# Patient Record
Sex: Male | Born: 1945
Health system: Southern US, Community
[De-identification: ages and names within clinical notes are randomized; demographics above are authoritative.]

## PROBLEM LIST (undated history)

## (undated) DIAGNOSIS — E7800 Pure hypercholesterolemia, unspecified: Secondary | ICD-10-CM

## (undated) DIAGNOSIS — R000 Tachycardia, unspecified: Secondary | ICD-10-CM

## (undated) DIAGNOSIS — N419 Inflammatory disease of prostate, unspecified: Secondary | ICD-10-CM

## (undated) DIAGNOSIS — K224 Dyskinesia of esophagus: Secondary | ICD-10-CM

## (undated) DIAGNOSIS — K219 Gastro-esophageal reflux disease without esophagitis: Secondary | ICD-10-CM

## (undated) DIAGNOSIS — Z87898 Personal history of other specified conditions: Secondary | ICD-10-CM

## (undated) DIAGNOSIS — C801 Malignant (primary) neoplasm, unspecified: Secondary | ICD-10-CM

## (undated) DIAGNOSIS — N4340 Spermatocele of epididymis, unspecified: Secondary | ICD-10-CM

## (undated) DIAGNOSIS — B002 Herpesviral gingivostomatitis and pharyngotonsillitis: Secondary | ICD-10-CM

## (undated) DIAGNOSIS — J302 Other seasonal allergic rhinitis: Secondary | ICD-10-CM

## (undated) DIAGNOSIS — K589 Irritable bowel syndrome without diarrhea: Secondary | ICD-10-CM

## (undated) DIAGNOSIS — N400 Enlarged prostate without lower urinary tract symptoms: Secondary | ICD-10-CM

## (undated) DIAGNOSIS — I2510 Atherosclerotic heart disease of native coronary artery without angina pectoris: Secondary | ICD-10-CM

## (undated) DIAGNOSIS — T8859XA Other complications of anesthesia, initial encounter: Secondary | ICD-10-CM

## (undated) DIAGNOSIS — E785 Hyperlipidemia, unspecified: Secondary | ICD-10-CM

## (undated) HISTORY — DX: Other seasonal allergic rhinitis: J30.2

## (undated) HISTORY — DX: Spermatocele of epididymis, unspecified: N43.40

## (undated) HISTORY — PX: HERNIA REPAIR: SHX51

## (undated) HISTORY — DX: Dyskinesia of esophagus: K22.4

## (undated) HISTORY — DX: Tachycardia, unspecified: R00.0

## (undated) HISTORY — PX: APPENDECTOMY: SHX54

## (undated) HISTORY — DX: Personal history of other specified conditions: Z87.898

## (undated) HISTORY — DX: Benign prostatic hyperplasia without lower urinary tract symptoms: N40.0

## (undated) HISTORY — PX: TONSILLECTOMY: SUR1361

## (undated) HISTORY — PX: TREATMENT FISTULA ANAL: SUR1390

## (undated) HISTORY — DX: Atherosclerotic heart disease of native coronary artery without angina pectoris: I25.10

## (undated) HISTORY — DX: Herpesviral gingivostomatitis and pharyngotonsillitis: B00.2

## (undated) HISTORY — DX: Inflammatory disease of prostate, unspecified: N41.9

## (undated) HISTORY — DX: Hyperlipidemia, unspecified: E78.5

---

## 1996-04-25 DIAGNOSIS — N419 Inflammatory disease of prostate, unspecified: Secondary | ICD-10-CM

## 1996-04-25 HISTORY — DX: Inflammatory disease of prostate, unspecified: N41.9

## 2000-01-12 ENCOUNTER — Ambulatory Visit (HOSPITAL_BASED_OUTPATIENT_CLINIC_OR_DEPARTMENT_OTHER): Admission: RE | Admit: 2000-01-12 | Discharge: 2000-01-12 | Payer: Self-pay | Admitting: Surgery

## 2003-07-10 ENCOUNTER — Ambulatory Visit (HOSPITAL_COMMUNITY): Admission: RE | Admit: 2003-07-10 | Discharge: 2003-07-10 | Payer: Self-pay | Admitting: Cardiology

## 2004-03-19 ENCOUNTER — Emergency Department (HOSPITAL_COMMUNITY): Admission: EM | Admit: 2004-03-19 | Discharge: 2004-03-19 | Payer: Self-pay | Admitting: Emergency Medicine

## 2004-11-10 ENCOUNTER — Ambulatory Visit (HOSPITAL_COMMUNITY): Admission: RE | Admit: 2004-11-10 | Discharge: 2004-11-10 | Payer: Self-pay | Admitting: Gastroenterology

## 2005-04-12 IMAGING — CR DG CHEST 2V
2 series · 2 of 2 positions shown · non-contrast
Comparison: none

CLINICAL DATA: Chest pressure.  Chest pain.  Nonsmoker.  Preop for cardiac catheterization.
 TWO VIEW CHEST 
 PA and lateral views of the chest are made without previous films for comparison and show some hyperaeration of the lungs with flattening of the hemidiaphragms and increased AP diameter of the chest.  No active infiltrate or consolidation is seen.  The heart, mediastinum and bony thorax appear normal.

[view not recorded (1 of 2)]
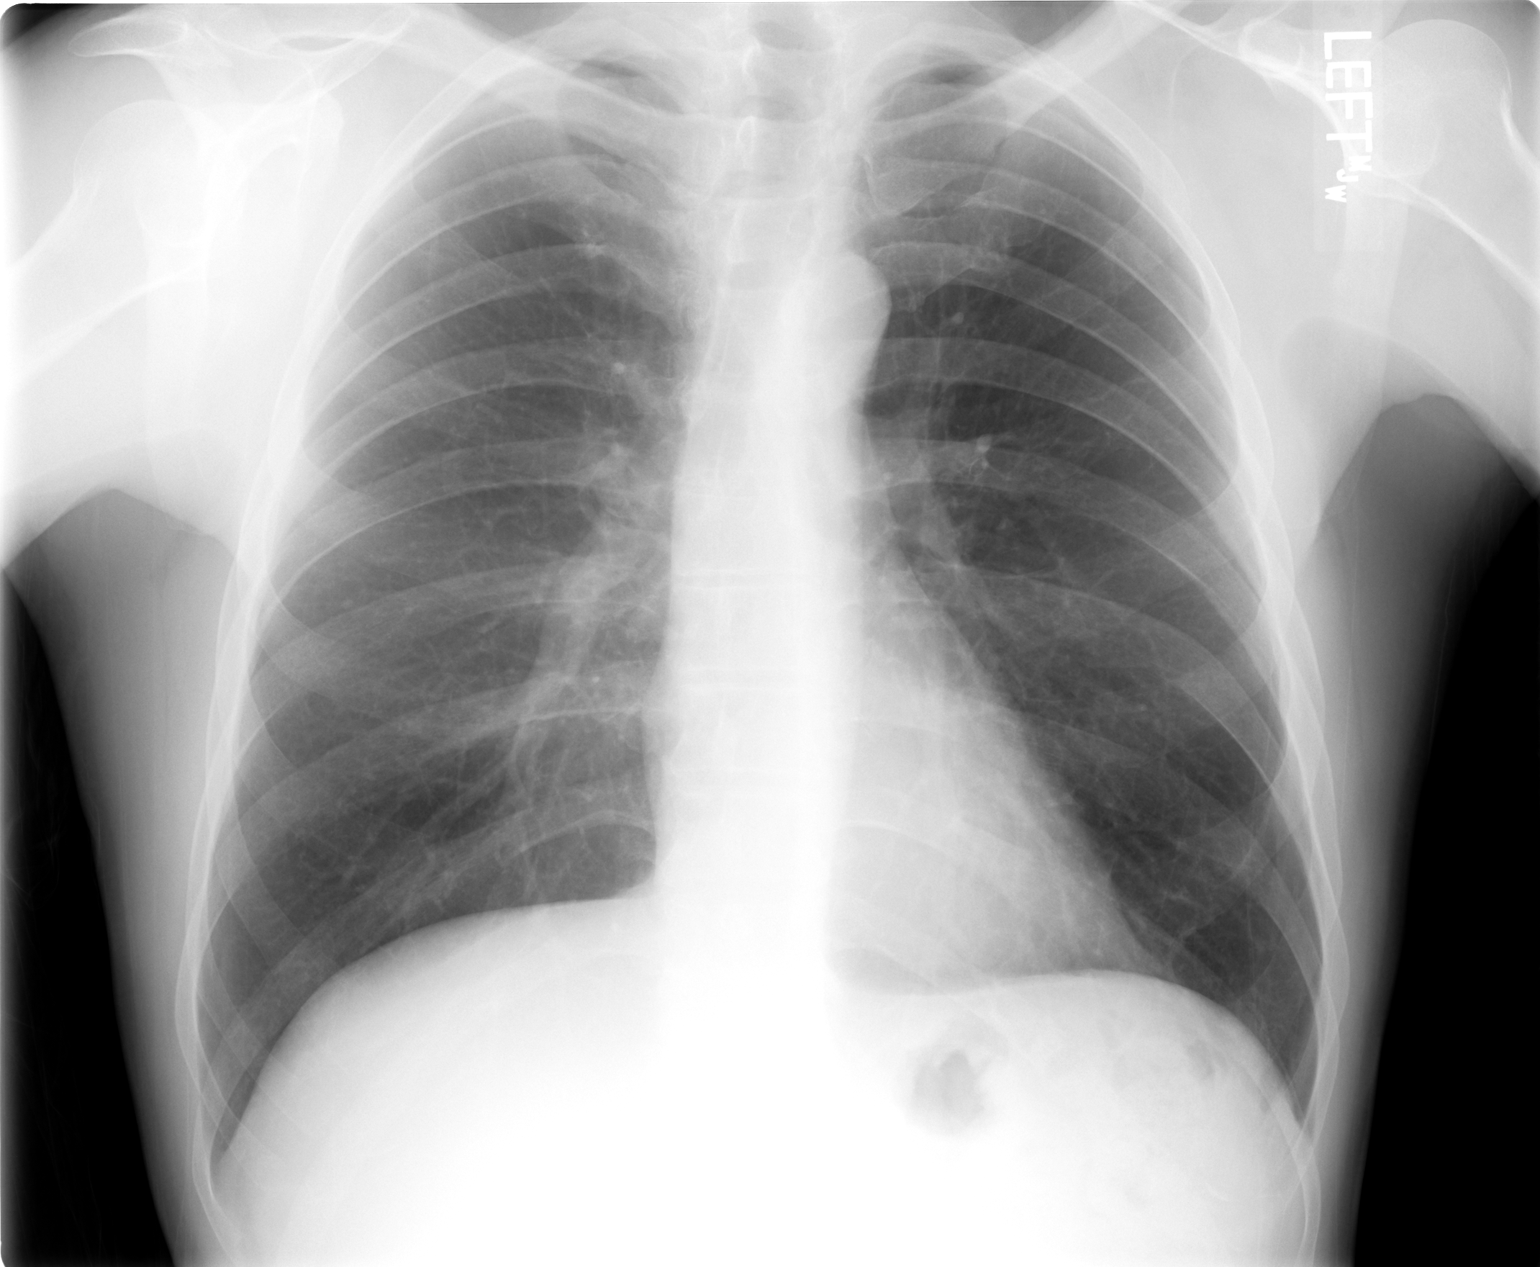

[view not recorded (2 of 2)]
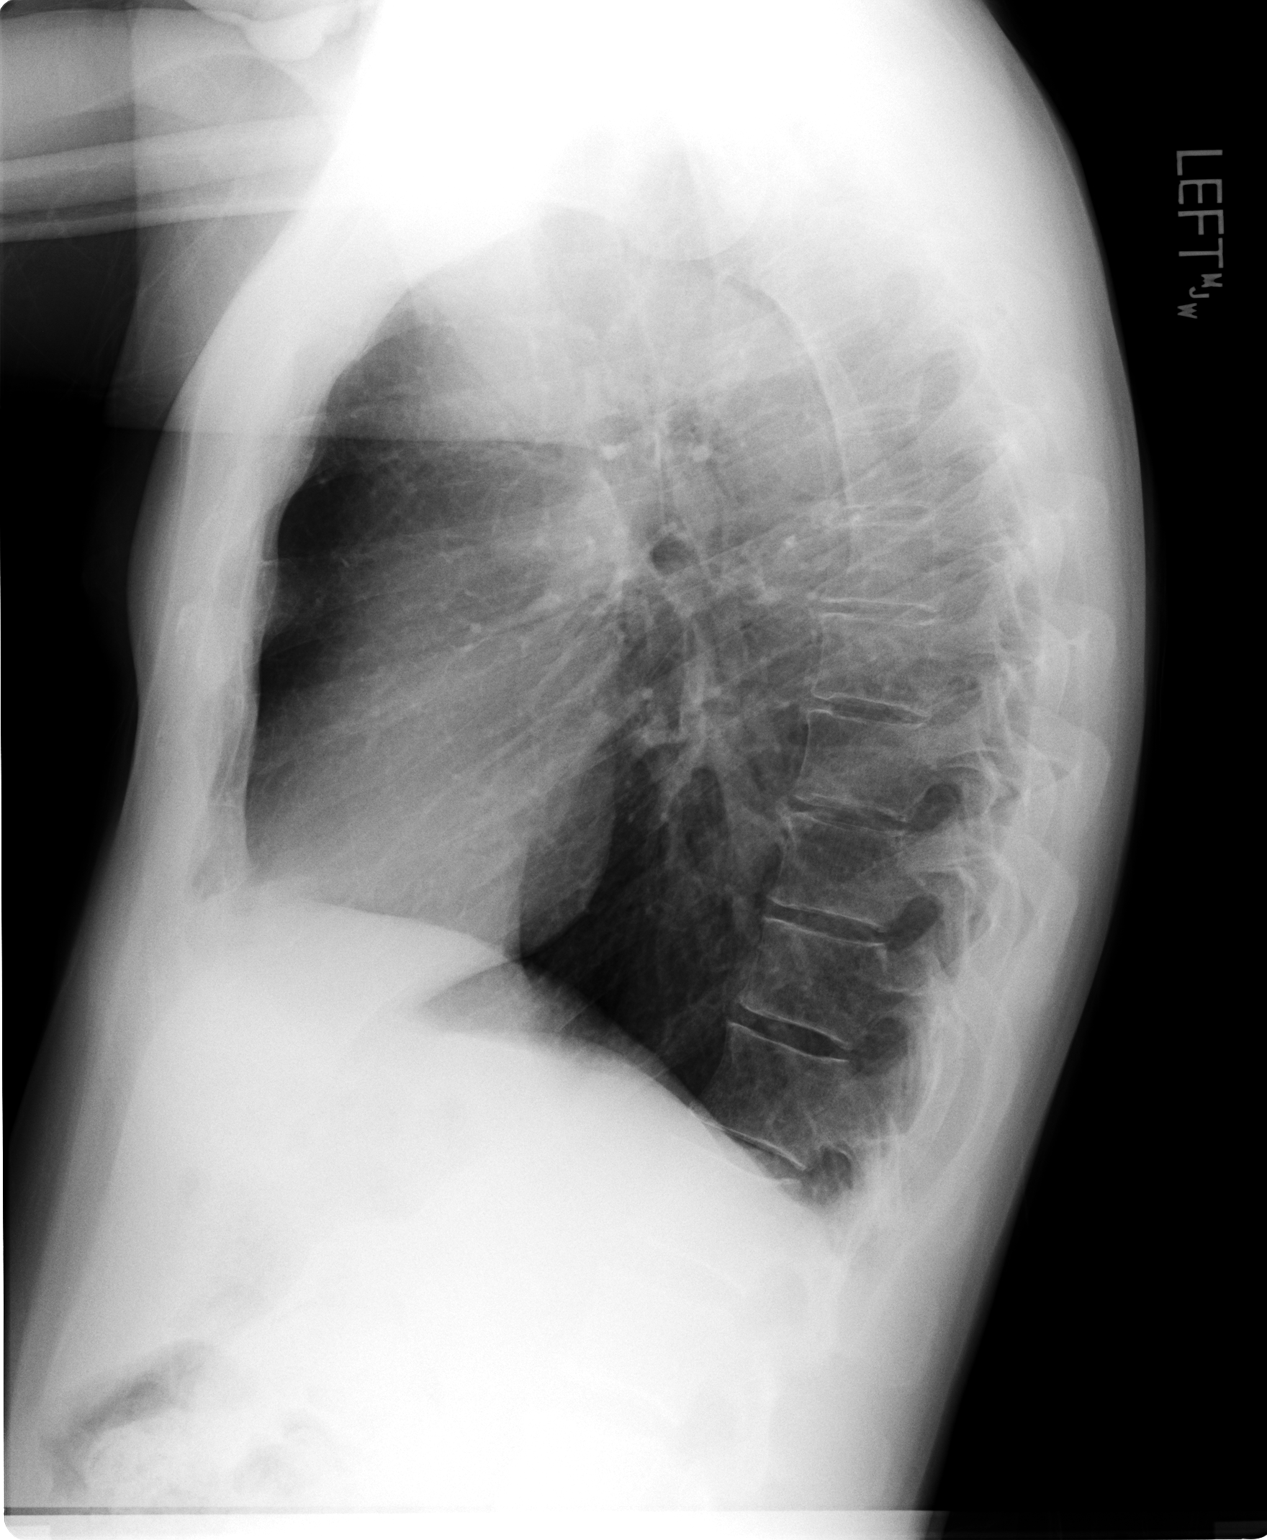

[2 of 2 positions shown; findings below may reference images not displayed]

IMPRESSION: Mild hyperinflation of the lungs.  No evidence of active disease.

## 2011-04-27 DIAGNOSIS — J029 Acute pharyngitis, unspecified: Secondary | ICD-10-CM | POA: Diagnosis not present

## 2011-04-27 DIAGNOSIS — J019 Acute sinusitis, unspecified: Secondary | ICD-10-CM | POA: Diagnosis not present

## 2011-04-27 DIAGNOSIS — R05 Cough: Secondary | ICD-10-CM | POA: Diagnosis not present

## 2011-07-18 DIAGNOSIS — R229 Localized swelling, mass and lump, unspecified: Secondary | ICD-10-CM | POA: Diagnosis not present

## 2011-08-17 DIAGNOSIS — Z1331 Encounter for screening for depression: Secondary | ICD-10-CM | POA: Diagnosis not present

## 2011-08-17 DIAGNOSIS — Z125 Encounter for screening for malignant neoplasm of prostate: Secondary | ICD-10-CM | POA: Diagnosis not present

## 2011-08-17 DIAGNOSIS — Z23 Encounter for immunization: Secondary | ICD-10-CM | POA: Diagnosis not present

## 2011-08-17 DIAGNOSIS — Z0000 Encounter for general adult medical examination without abnormal findings: Secondary | ICD-10-CM | POA: Diagnosis not present

## 2011-08-17 DIAGNOSIS — E785 Hyperlipidemia, unspecified: Secondary | ICD-10-CM | POA: Diagnosis not present

## 2012-02-14 DIAGNOSIS — Z23 Encounter for immunization: Secondary | ICD-10-CM | POA: Diagnosis not present

## 2012-04-09 DIAGNOSIS — H40019 Open angle with borderline findings, low risk, unspecified eye: Secondary | ICD-10-CM | POA: Diagnosis not present

## 2012-04-09 DIAGNOSIS — H52209 Unspecified astigmatism, unspecified eye: Secondary | ICD-10-CM | POA: Diagnosis not present

## 2012-04-09 DIAGNOSIS — H2510 Age-related nuclear cataract, unspecified eye: Secondary | ICD-10-CM | POA: Diagnosis not present

## 2012-04-09 DIAGNOSIS — H11009 Unspecified pterygium of unspecified eye: Secondary | ICD-10-CM | POA: Diagnosis not present

## 2012-05-17 DIAGNOSIS — R300 Dysuria: Secondary | ICD-10-CM | POA: Diagnosis not present

## 2012-05-17 DIAGNOSIS — N410 Acute prostatitis: Secondary | ICD-10-CM | POA: Diagnosis not present

## 2012-05-21 DIAGNOSIS — N390 Urinary tract infection, site not specified: Secondary | ICD-10-CM | POA: Diagnosis not present

## 2012-05-21 DIAGNOSIS — N410 Acute prostatitis: Secondary | ICD-10-CM | POA: Diagnosis not present

## 2012-05-28 DIAGNOSIS — N390 Urinary tract infection, site not specified: Secondary | ICD-10-CM | POA: Diagnosis not present

## 2012-08-21 DIAGNOSIS — Z1331 Encounter for screening for depression: Secondary | ICD-10-CM | POA: Diagnosis not present

## 2012-08-21 DIAGNOSIS — N419 Inflammatory disease of prostate, unspecified: Secondary | ICD-10-CM | POA: Diagnosis not present

## 2012-08-21 DIAGNOSIS — Z0000 Encounter for general adult medical examination without abnormal findings: Secondary | ICD-10-CM | POA: Diagnosis not present

## 2012-08-21 DIAGNOSIS — Z131 Encounter for screening for diabetes mellitus: Secondary | ICD-10-CM | POA: Diagnosis not present

## 2012-08-21 DIAGNOSIS — E785 Hyperlipidemia, unspecified: Secondary | ICD-10-CM | POA: Diagnosis not present

## 2013-02-05 DIAGNOSIS — Z23 Encounter for immunization: Secondary | ICD-10-CM | POA: Diagnosis not present

## 2013-04-12 DIAGNOSIS — H11009 Unspecified pterygium of unspecified eye: Secondary | ICD-10-CM | POA: Diagnosis not present

## 2013-04-12 DIAGNOSIS — H43819 Vitreous degeneration, unspecified eye: Secondary | ICD-10-CM | POA: Diagnosis not present

## 2013-04-12 DIAGNOSIS — H5200 Hypermetropia, unspecified eye: Secondary | ICD-10-CM | POA: Diagnosis not present

## 2013-04-12 DIAGNOSIS — H2510 Age-related nuclear cataract, unspecified eye: Secondary | ICD-10-CM | POA: Diagnosis not present

## 2013-07-07 ENCOUNTER — Encounter (HOSPITAL_COMMUNITY): Payer: Self-pay | Admitting: Emergency Medicine

## 2013-07-07 ENCOUNTER — Emergency Department (HOSPITAL_COMMUNITY): Payer: Medicare Other

## 2013-07-07 ENCOUNTER — Emergency Department (HOSPITAL_COMMUNITY)
Admission: EM | Admit: 2013-07-07 | Discharge: 2013-07-07 | Disposition: A | Discharge status: Home / Self Care | Payer: Medicare Other | Attending: Emergency Medicine | Admitting: Emergency Medicine

## 2013-07-07 DIAGNOSIS — E7800 Pure hypercholesterolemia, unspecified: Secondary | ICD-10-CM | POA: Insufficient documentation

## 2013-07-07 DIAGNOSIS — Z8639 Personal history of other endocrine, nutritional and metabolic disease: Secondary | ICD-10-CM | POA: Insufficient documentation

## 2013-07-07 DIAGNOSIS — R194 Change in bowel habit: Secondary | ICD-10-CM

## 2013-07-07 DIAGNOSIS — R42 Dizziness and giddiness: Secondary | ICD-10-CM | POA: Diagnosis not present

## 2013-07-07 DIAGNOSIS — K5900 Constipation, unspecified: Secondary | ICD-10-CM | POA: Insufficient documentation

## 2013-07-07 DIAGNOSIS — Z862 Personal history of diseases of the blood and blood-forming organs and certain disorders involving the immune mechanism: Secondary | ICD-10-CM | POA: Insufficient documentation

## 2013-07-07 DIAGNOSIS — R404 Transient alteration of awareness: Secondary | ICD-10-CM | POA: Diagnosis not present

## 2013-07-07 DIAGNOSIS — R198 Other specified symptoms and signs involving the digestive system and abdomen: Secondary | ICD-10-CM | POA: Insufficient documentation

## 2013-07-07 HISTORY — DX: Irritable bowel syndrome, unspecified: K58.9

## 2013-07-07 HISTORY — DX: Pure hypercholesterolemia, unspecified: E78.00

## 2013-07-07 LAB — URINALYSIS, ROUTINE W REFLEX MICROSCOPIC
Bilirubin Urine: NEGATIVE
Glucose, UA: NEGATIVE mg/dL
Hgb urine dipstick: NEGATIVE
Ketones, ur: NEGATIVE mg/dL
Leukocytes, UA: NEGATIVE
Nitrite: NEGATIVE
Protein, ur: NEGATIVE mg/dL
Specific Gravity, Urine: 1.019 (ref 1.005–1.030)
Urobilinogen, UA: 0.2 mg/dL (ref 0.0–1.0)
pH: 5.5 (ref 5.0–8.0)

## 2013-07-07 LAB — COMPREHENSIVE METABOLIC PANEL
ALT: 15 U/L (ref 0–53)
AST: 18 U/L (ref 0–37)
Albumin: 3.8 g/dL (ref 3.5–5.2)
Alkaline Phosphatase: 60 U/L (ref 39–117)
BUN: 15 mg/dL (ref 6–23)
CO2: 26 mEq/L (ref 19–32)
Calcium: 9.4 mg/dL (ref 8.4–10.5)
Chloride: 102 mEq/L (ref 96–112)
Creatinine, Ser: 0.98 mg/dL (ref 0.50–1.35)
GFR calc Af Amer: >90 mL/min (ref >90)
GFR calc non Af Amer: 83 mL/min — ABNORMAL LOW (ref >90)
Glucose, Bld: 115 mg/dL — ABNORMAL HIGH (ref 70–99)
Potassium: 4.2 mEq/L (ref 3.7–5.3)
Sodium: 140 mEq/L (ref 137–147)
Total Bilirubin: 0.2 mg/dL — ABNORMAL LOW (ref 0.3–1.2)
Total Protein: 6.6 g/dL (ref 6.0–8.3)

## 2013-07-07 LAB — CBC WITH DIFFERENTIAL/PLATELET
Basophils Absolute: 0 10*3/uL (ref 0.0–0.1)
Basophils Relative: 1 % (ref 0–1)
Eosinophils Absolute: 0.1 10*3/uL (ref 0.0–0.7)
Eosinophils Relative: 2 % (ref 0–5)
HCT: 42.7 % (ref 39.0–52.0)
Hemoglobin: 14.8 g/dL (ref 13.0–17.0)
Lymphocytes Relative: 38 % (ref 12–46)
Lymphs Abs: 1.9 10*3/uL (ref 0.7–4.0)
MCH: 31.2 pg (ref 26.0–34.0)
MCHC: 34.7 g/dL (ref 30.0–36.0)
MCV: 90.1 fL (ref 78.0–100.0)
Monocytes Absolute: 0.2 10*3/uL (ref 0.1–1.0)
Monocytes Relative: 5 % (ref 3–12)
Neutro Abs: 2.8 10*3/uL (ref 1.7–7.7)
Neutrophils Relative %: 55 % (ref 43–77)
Platelets: 218 10*3/uL (ref 150–400)
RBC: 4.74 MIL/uL (ref 4.22–5.81)
RDW: 13.6 % (ref 11.5–15.5)
WBC: 5.1 10*3/uL (ref 4.0–10.5)

## 2013-07-07 LAB — LIPASE, BLOOD: Lipase: 69 U/L — ABNORMAL HIGH (ref 11–59)

## 2013-07-07 MED ORDER — FLEET ENEMA 7-19 GM/118ML RE ENEM
1.0000 | ENEMA | Freq: Once | RECTAL | Status: AC
  Administered 2013-07-07: 1 via RECTAL
  Filled 2013-07-07: qty 1

## 2013-07-07 NOTE — ED Notes (Signed)
Dealing with constipation, took enema 3/14 AM, mineral oil, etc.  Went in to use the BR tonight, became dizzy, pale, clammy.  Has not had BM in approx one week.

## 2013-07-07 NOTE — Discharge Instructions (Signed)
You may continue to have diarrhea for the next 24 hours or so, this is to be expected. You may continue eating and drinking as normal. Follow up with your primary care physician if problems occur. Return to the ED for new or worsening symptoms.

## 2013-07-07 NOTE — ED Notes (Signed)
PT aware Urine sample is needed .

## 2013-07-07 NOTE — ED Provider Notes (Signed)
CSN: [id number]     Arrival date & time 07/07/13  0531 History   None    Chief Complaint  Patient presents with  . Abdominal Pain     (Consider location/radiation/quality/duration/timing/severity/associated sxs/prior Treatment) The history is provided by the patient and medical records.   This is a 68 y.o. M with PMH significant for hyperlipidemia and IBS presenting to the ED for constipation.  Pt states he has not been able to have a bowel movement in approx 1 week despite taking stool softeners and using an enema yesterday.  States he did have a tiny bowel movement a few days ago but it was not enough to provide any sort of relief.  Pt got up to use the bathroom tonight, became dizzy and diaphoretic with some mild nausea and dry heaves which resolved prior to arrival in the ED.  No nausea or vomiting until this morning.  No early satiety.  Pt denies taking any narcotic pain medications.  Does not usually have problems with constipation but does have IBS.  Pt is still able to freely pass flatus.  Prior abdominal surgeries including appendectomy and hernia repair.  Denies fevers or chills.  VS stable on arrival.  Past Medical History  Diagnosis Date  . Hypercholesterolemia   . Irritable bowel syndrome    Past Surgical History  Procedure Laterality Date  . Hernia repair    . Tonsillectomy    . Appendectomy    . Treatment fistula anal     History reviewed. No pertinent family history. History  Substance Use Topics  . Smoking status: Never Smoker   . Smokeless tobacco: Not on file  . Alcohol Use: Yes     Comment: 4 beers, 2 glasses of wine weekly    Review of Systems  Gastrointestinal: Positive for constipation.  All other systems reviewed and are negative.   Allergies  Review of patient's allergies indicates no known allergies.  Home Medications  No current outpatient prescriptions on file. Ht 5' 5.5" (1.664 m)  Wt 142 lb (64.411 kg)  BMI 23.26 kg/m2  Physical Exam   Nursing note and vitals reviewed. Constitutional: He is oriented to person, place, and time. He appears well-developed and well-nourished. No distress.  HENT:  Head: Normocephalic and atraumatic.  Mouth/Throat: Oropharynx is clear and moist.  Eyes: Conjunctivae and EOM are normal. Pupils are equal, round, and reactive to light.  Neck: Normal range of motion. Neck supple.  Cardiovascular: Normal rate, regular rhythm and normal heart sounds.   Pulmonary/Chest: Effort normal and breath sounds normal. No respiratory distress. He has no wheezes.  Abdominal: Soft. Bowel sounds are normal. There is no tenderness. There is no guarding and no CVA tenderness.  Abdomen soft, non-distended, no peritoneal signs  Musculoskeletal: Normal range of motion. He exhibits no edema.  Neurological: He is alert and oriented to person, place, and time.  Skin: Skin is warm and dry. He is not diaphoretic.  Psychiatric: He has a normal mood and affect.    ED Course  Procedures (including critical care time) Labs Review Labs Reviewed  COMPREHENSIVE METABOLIC PANEL - Abnormal; Notable for the following:    Glucose, Bld 115 (*)    Total Bilirubin 0.2 (*)    GFR calc non Af Amer 83 (*)    All other components within normal limits  LIPASE, BLOOD - Abnormal; Notable for the following:    Lipase 69 (*)    All other components within normal limits  CBC WITH DIFFERENTIAL  URINALYSIS, ROUTINE W REFLEX MICROSCOPIC   Imaging Review Dg Abd Acute W/chest  07/07/2013   CLINICAL DATA:  Syncope, constipation  EXAM: ACUTE ABDOMEN SERIES (ABDOMEN 2 VIEW & CHEST 1 VIEW)  COMPARISON:  Tensor prior  FINDINGS: There is no evidence of dilated bowel loops or free intraperitoneal air. No radiopaque calculi or other significant radiographic abnormality is seen. Heart size and mediastinal contours are within normal limits. Both lungs are clear. Large volume of stool noted throughout nondilated colon.  IMPRESSION: Large volume of stool  throughout nondilated colon.   Electronically Signed   By: Conchita Paris M.D.   On: 07/07/2013 07:28     EKG Interpretation None      MDM   Final diagnoses:  Change in bowel habits   On initial evaluation, pt is afebrile and overall non-toxic appearing.  He is not complaining of abdominal pain, distention, or nausea at this time.  He is freely passing flatus.  Will start with plain film, if inconclusive will obtain CT.  Acute abd series with large amount of stool, no dilated loops of bowel to suggest SBO.  Planned to give enema at home, however pts wife requests it be done here as he has tried several at home already.  9:09 AM Pt had moderate sized bowel movement, states he feels better and is ready to go home.  Advised that he may develop some diarrhea which is normal but he should not take immodium.  FU with PCP if problems occur.  Signs/sx that would warrant ED return including severe abdominal pain, nausea, vomiting, bloody diarrhea, dizziness, etc were discussed-- pt and wife acknowledged understanding and agreed with plan of care.  Larene Pickett, PA-C 07/07/13 1140

## 2013-07-10 DIAGNOSIS — K5900 Constipation, unspecified: Secondary | ICD-10-CM | POA: Diagnosis not present

## 2013-07-11 NOTE — ED Provider Notes (Signed)
Medical screening examination/treatment/procedure(s) were performed by non-physician practitioner and as supervising physician I was immediately available for consultation/collaboration.   EKG Interpretation None       Varney Biles, MD 07/11/13 0401

## 2013-08-29 DIAGNOSIS — Z125 Encounter for screening for malignant neoplasm of prostate: Secondary | ICD-10-CM | POA: Diagnosis not present

## 2013-08-29 DIAGNOSIS — E785 Hyperlipidemia, unspecified: Secondary | ICD-10-CM | POA: Diagnosis not present

## 2013-08-29 DIAGNOSIS — Z23 Encounter for immunization: Secondary | ICD-10-CM | POA: Diagnosis not present

## 2013-08-29 DIAGNOSIS — Z0000 Encounter for general adult medical examination without abnormal findings: Secondary | ICD-10-CM | POA: Diagnosis not present

## 2014-01-13 DIAGNOSIS — Z23 Encounter for immunization: Secondary | ICD-10-CM | POA: Diagnosis not present

## 2014-02-25 DIAGNOSIS — H52203 Unspecified astigmatism, bilateral: Secondary | ICD-10-CM | POA: Diagnosis not present

## 2014-02-25 DIAGNOSIS — H40013 Open angle with borderline findings, low risk, bilateral: Secondary | ICD-10-CM | POA: Diagnosis not present

## 2014-08-07 ENCOUNTER — Encounter: Payer: Self-pay | Admitting: Family Medicine

## 2014-08-07 NOTE — Telephone Encounter (Signed)
error 

## 2014-08-13 DIAGNOSIS — M9901 Segmental and somatic dysfunction of cervical region: Secondary | ICD-10-CM | POA: Diagnosis not present

## 2014-08-13 DIAGNOSIS — M9902 Segmental and somatic dysfunction of thoracic region: Secondary | ICD-10-CM | POA: Diagnosis not present

## 2014-08-13 DIAGNOSIS — M47812 Spondylosis without myelopathy or radiculopathy, cervical region: Secondary | ICD-10-CM | POA: Diagnosis not present

## 2014-08-13 DIAGNOSIS — M4304 Spondylolysis, thoracic region: Secondary | ICD-10-CM | POA: Diagnosis not present

## 2014-08-13 DIAGNOSIS — M461 Sacroiliitis, not elsewhere classified: Secondary | ICD-10-CM | POA: Diagnosis not present

## 2014-08-13 DIAGNOSIS — M9904 Segmental and somatic dysfunction of sacral region: Secondary | ICD-10-CM | POA: Diagnosis not present

## 2014-08-20 DIAGNOSIS — M4304 Spondylolysis, thoracic region: Secondary | ICD-10-CM | POA: Diagnosis not present

## 2014-08-20 DIAGNOSIS — M9902 Segmental and somatic dysfunction of thoracic region: Secondary | ICD-10-CM | POA: Diagnosis not present

## 2014-08-20 DIAGNOSIS — M47812 Spondylosis without myelopathy or radiculopathy, cervical region: Secondary | ICD-10-CM | POA: Diagnosis not present

## 2014-08-20 DIAGNOSIS — M9901 Segmental and somatic dysfunction of cervical region: Secondary | ICD-10-CM | POA: Diagnosis not present

## 2014-08-20 DIAGNOSIS — M9904 Segmental and somatic dysfunction of sacral region: Secondary | ICD-10-CM | POA: Diagnosis not present

## 2014-08-20 DIAGNOSIS — M461 Sacroiliitis, not elsewhere classified: Secondary | ICD-10-CM | POA: Diagnosis not present

## 2014-08-26 DIAGNOSIS — M4304 Spondylolysis, thoracic region: Secondary | ICD-10-CM | POA: Diagnosis not present

## 2014-08-26 DIAGNOSIS — M47812 Spondylosis without myelopathy or radiculopathy, cervical region: Secondary | ICD-10-CM | POA: Diagnosis not present

## 2014-08-26 DIAGNOSIS — M461 Sacroiliitis, not elsewhere classified: Secondary | ICD-10-CM | POA: Diagnosis not present

## 2014-08-26 DIAGNOSIS — M9901 Segmental and somatic dysfunction of cervical region: Secondary | ICD-10-CM | POA: Diagnosis not present

## 2014-08-26 DIAGNOSIS — M9902 Segmental and somatic dysfunction of thoracic region: Secondary | ICD-10-CM | POA: Diagnosis not present

## 2014-08-26 DIAGNOSIS — M9904 Segmental and somatic dysfunction of sacral region: Secondary | ICD-10-CM | POA: Diagnosis not present

## 2014-09-01 DIAGNOSIS — M47812 Spondylosis without myelopathy or radiculopathy, cervical region: Secondary | ICD-10-CM | POA: Diagnosis not present

## 2014-09-01 DIAGNOSIS — M4304 Spondylolysis, thoracic region: Secondary | ICD-10-CM | POA: Diagnosis not present

## 2014-09-01 DIAGNOSIS — M9904 Segmental and somatic dysfunction of sacral region: Secondary | ICD-10-CM | POA: Diagnosis not present

## 2014-09-01 DIAGNOSIS — M9902 Segmental and somatic dysfunction of thoracic region: Secondary | ICD-10-CM | POA: Diagnosis not present

## 2014-09-01 DIAGNOSIS — M9901 Segmental and somatic dysfunction of cervical region: Secondary | ICD-10-CM | POA: Diagnosis not present

## 2014-09-01 DIAGNOSIS — M461 Sacroiliitis, not elsewhere classified: Secondary | ICD-10-CM | POA: Diagnosis not present

## 2014-09-03 DIAGNOSIS — N400 Enlarged prostate without lower urinary tract symptoms: Secondary | ICD-10-CM | POA: Diagnosis not present

## 2014-09-03 DIAGNOSIS — Z0000 Encounter for general adult medical examination without abnormal findings: Secondary | ICD-10-CM | POA: Diagnosis not present

## 2014-09-03 DIAGNOSIS — Z1389 Encounter for screening for other disorder: Secondary | ICD-10-CM | POA: Diagnosis not present

## 2014-09-03 DIAGNOSIS — I2510 Atherosclerotic heart disease of native coronary artery without angina pectoris: Secondary | ICD-10-CM | POA: Diagnosis not present

## 2014-09-03 DIAGNOSIS — E780 Pure hypercholesterolemia: Secondary | ICD-10-CM | POA: Diagnosis not present

## 2014-09-03 DIAGNOSIS — Z125 Encounter for screening for malignant neoplasm of prostate: Secondary | ICD-10-CM | POA: Diagnosis not present

## 2014-09-17 DIAGNOSIS — M4304 Spondylolysis, thoracic region: Secondary | ICD-10-CM | POA: Diagnosis not present

## 2014-09-17 DIAGNOSIS — M9901 Segmental and somatic dysfunction of cervical region: Secondary | ICD-10-CM | POA: Diagnosis not present

## 2014-09-17 DIAGNOSIS — M47812 Spondylosis without myelopathy or radiculopathy, cervical region: Secondary | ICD-10-CM | POA: Diagnosis not present

## 2014-09-17 DIAGNOSIS — M461 Sacroiliitis, not elsewhere classified: Secondary | ICD-10-CM | POA: Diagnosis not present

## 2014-09-17 DIAGNOSIS — M9902 Segmental and somatic dysfunction of thoracic region: Secondary | ICD-10-CM | POA: Diagnosis not present

## 2014-09-17 DIAGNOSIS — M9904 Segmental and somatic dysfunction of sacral region: Secondary | ICD-10-CM | POA: Diagnosis not present

## 2014-11-05 DIAGNOSIS — M4304 Spondylolysis, thoracic region: Secondary | ICD-10-CM | POA: Diagnosis not present

## 2014-11-05 DIAGNOSIS — M9902 Segmental and somatic dysfunction of thoracic region: Secondary | ICD-10-CM | POA: Diagnosis not present

## 2014-11-05 DIAGNOSIS — M9904 Segmental and somatic dysfunction of sacral region: Secondary | ICD-10-CM | POA: Diagnosis not present

## 2014-11-05 DIAGNOSIS — M9901 Segmental and somatic dysfunction of cervical region: Secondary | ICD-10-CM | POA: Diagnosis not present

## 2014-11-05 DIAGNOSIS — M461 Sacroiliitis, not elsewhere classified: Secondary | ICD-10-CM | POA: Diagnosis not present

## 2014-11-05 DIAGNOSIS — M47812 Spondylosis without myelopathy or radiculopathy, cervical region: Secondary | ICD-10-CM | POA: Diagnosis not present

## 2014-11-12 DIAGNOSIS — M461 Sacroiliitis, not elsewhere classified: Secondary | ICD-10-CM | POA: Diagnosis not present

## 2014-11-12 DIAGNOSIS — M47812 Spondylosis without myelopathy or radiculopathy, cervical region: Secondary | ICD-10-CM | POA: Diagnosis not present

## 2014-11-12 DIAGNOSIS — M9904 Segmental and somatic dysfunction of sacral region: Secondary | ICD-10-CM | POA: Diagnosis not present

## 2014-11-12 DIAGNOSIS — M9902 Segmental and somatic dysfunction of thoracic region: Secondary | ICD-10-CM | POA: Diagnosis not present

## 2014-11-12 DIAGNOSIS — M9901 Segmental and somatic dysfunction of cervical region: Secondary | ICD-10-CM | POA: Diagnosis not present

## 2014-11-12 DIAGNOSIS — M4304 Spondylolysis, thoracic region: Secondary | ICD-10-CM | POA: Diagnosis not present

## 2014-12-03 DIAGNOSIS — M9901 Segmental and somatic dysfunction of cervical region: Secondary | ICD-10-CM | POA: Diagnosis not present

## 2014-12-03 DIAGNOSIS — M9904 Segmental and somatic dysfunction of sacral region: Secondary | ICD-10-CM | POA: Diagnosis not present

## 2014-12-03 DIAGNOSIS — M461 Sacroiliitis, not elsewhere classified: Secondary | ICD-10-CM | POA: Diagnosis not present

## 2014-12-03 DIAGNOSIS — M9902 Segmental and somatic dysfunction of thoracic region: Secondary | ICD-10-CM | POA: Diagnosis not present

## 2014-12-03 DIAGNOSIS — M4304 Spondylolysis, thoracic region: Secondary | ICD-10-CM | POA: Diagnosis not present

## 2014-12-03 DIAGNOSIS — M47812 Spondylosis without myelopathy or radiculopathy, cervical region: Secondary | ICD-10-CM | POA: Diagnosis not present

## 2014-12-08 DIAGNOSIS — Z1211 Encounter for screening for malignant neoplasm of colon: Secondary | ICD-10-CM | POA: Diagnosis not present

## 2014-12-08 DIAGNOSIS — K5730 Diverticulosis of large intestine without perforation or abscess without bleeding: Secondary | ICD-10-CM | POA: Diagnosis not present

## 2014-12-17 DIAGNOSIS — M9902 Segmental and somatic dysfunction of thoracic region: Secondary | ICD-10-CM | POA: Diagnosis not present

## 2014-12-17 DIAGNOSIS — M461 Sacroiliitis, not elsewhere classified: Secondary | ICD-10-CM | POA: Diagnosis not present

## 2014-12-17 DIAGNOSIS — M9901 Segmental and somatic dysfunction of cervical region: Secondary | ICD-10-CM | POA: Diagnosis not present

## 2014-12-17 DIAGNOSIS — M4304 Spondylolysis, thoracic region: Secondary | ICD-10-CM | POA: Diagnosis not present

## 2014-12-17 DIAGNOSIS — M47812 Spondylosis without myelopathy or radiculopathy, cervical region: Secondary | ICD-10-CM | POA: Diagnosis not present

## 2014-12-17 DIAGNOSIS — M9904 Segmental and somatic dysfunction of sacral region: Secondary | ICD-10-CM | POA: Diagnosis not present

## 2015-02-11 DIAGNOSIS — Z23 Encounter for immunization: Secondary | ICD-10-CM | POA: Diagnosis not present

## 2015-03-03 DIAGNOSIS — H52203 Unspecified astigmatism, bilateral: Secondary | ICD-10-CM | POA: Diagnosis not present

## 2015-03-03 DIAGNOSIS — H40013 Open angle with borderline findings, low risk, bilateral: Secondary | ICD-10-CM | POA: Diagnosis not present

## 2015-05-11 DIAGNOSIS — M47812 Spondylosis without myelopathy or radiculopathy, cervical region: Secondary | ICD-10-CM | POA: Diagnosis not present

## 2015-05-11 DIAGNOSIS — M9904 Segmental and somatic dysfunction of sacral region: Secondary | ICD-10-CM | POA: Diagnosis not present

## 2015-05-11 DIAGNOSIS — M9901 Segmental and somatic dysfunction of cervical region: Secondary | ICD-10-CM | POA: Diagnosis not present

## 2015-05-11 DIAGNOSIS — M4818 Ankylosing hyperostosis [Forestier], sacral and sacrococcygeal region: Secondary | ICD-10-CM | POA: Diagnosis not present

## 2015-05-11 DIAGNOSIS — M47813 Spondylosis without myelopathy or radiculopathy, cervicothoracic region: Secondary | ICD-10-CM | POA: Diagnosis not present

## 2015-05-11 DIAGNOSIS — M9902 Segmental and somatic dysfunction of thoracic region: Secondary | ICD-10-CM | POA: Diagnosis not present

## 2015-06-17 DIAGNOSIS — M47812 Spondylosis without myelopathy or radiculopathy, cervical region: Secondary | ICD-10-CM | POA: Diagnosis not present

## 2015-06-17 DIAGNOSIS — M4818 Ankylosing hyperostosis [Forestier], sacral and sacrococcygeal region: Secondary | ICD-10-CM | POA: Diagnosis not present

## 2015-06-17 DIAGNOSIS — M9902 Segmental and somatic dysfunction of thoracic region: Secondary | ICD-10-CM | POA: Diagnosis not present

## 2015-06-17 DIAGNOSIS — M9901 Segmental and somatic dysfunction of cervical region: Secondary | ICD-10-CM | POA: Diagnosis not present

## 2015-06-17 DIAGNOSIS — M47813 Spondylosis without myelopathy or radiculopathy, cervicothoracic region: Secondary | ICD-10-CM | POA: Diagnosis not present

## 2015-06-17 DIAGNOSIS — M9904 Segmental and somatic dysfunction of sacral region: Secondary | ICD-10-CM | POA: Diagnosis not present

## 2015-07-08 DIAGNOSIS — R0789 Other chest pain: Secondary | ICD-10-CM | POA: Diagnosis not present

## 2015-09-04 DIAGNOSIS — Z125 Encounter for screening for malignant neoplasm of prostate: Secondary | ICD-10-CM | POA: Diagnosis not present

## 2015-09-04 DIAGNOSIS — Z1389 Encounter for screening for other disorder: Secondary | ICD-10-CM | POA: Diagnosis not present

## 2015-09-04 DIAGNOSIS — Z0000 Encounter for general adult medical examination without abnormal findings: Secondary | ICD-10-CM | POA: Diagnosis not present

## 2015-09-04 DIAGNOSIS — E7800 Pure hypercholesterolemia, unspecified: Secondary | ICD-10-CM | POA: Diagnosis not present

## 2015-09-22 DIAGNOSIS — M5442 Lumbago with sciatica, left side: Secondary | ICD-10-CM | POA: Diagnosis not present

## 2015-09-22 DIAGNOSIS — M9901 Segmental and somatic dysfunction of cervical region: Secondary | ICD-10-CM | POA: Diagnosis not present

## 2015-09-22 DIAGNOSIS — M5412 Radiculopathy, cervical region: Secondary | ICD-10-CM | POA: Diagnosis not present

## 2015-09-22 DIAGNOSIS — M7541 Impingement syndrome of right shoulder: Secondary | ICD-10-CM | POA: Diagnosis not present

## 2015-09-22 DIAGNOSIS — M9907 Segmental and somatic dysfunction of upper extremity: Secondary | ICD-10-CM | POA: Diagnosis not present

## 2015-09-22 DIAGNOSIS — M9903 Segmental and somatic dysfunction of lumbar region: Secondary | ICD-10-CM | POA: Diagnosis not present

## 2015-09-28 DIAGNOSIS — M7541 Impingement syndrome of right shoulder: Secondary | ICD-10-CM | POA: Diagnosis not present

## 2015-09-28 DIAGNOSIS — M9907 Segmental and somatic dysfunction of upper extremity: Secondary | ICD-10-CM | POA: Diagnosis not present

## 2015-09-28 DIAGNOSIS — M9903 Segmental and somatic dysfunction of lumbar region: Secondary | ICD-10-CM | POA: Diagnosis not present

## 2015-09-28 DIAGNOSIS — M5412 Radiculopathy, cervical region: Secondary | ICD-10-CM | POA: Diagnosis not present

## 2015-09-28 DIAGNOSIS — M9901 Segmental and somatic dysfunction of cervical region: Secondary | ICD-10-CM | POA: Diagnosis not present

## 2015-09-28 DIAGNOSIS — M5442 Lumbago with sciatica, left side: Secondary | ICD-10-CM | POA: Diagnosis not present

## 2015-10-12 DIAGNOSIS — M7541 Impingement syndrome of right shoulder: Secondary | ICD-10-CM | POA: Diagnosis not present

## 2015-10-12 DIAGNOSIS — M9903 Segmental and somatic dysfunction of lumbar region: Secondary | ICD-10-CM | POA: Diagnosis not present

## 2015-10-12 DIAGNOSIS — M5412 Radiculopathy, cervical region: Secondary | ICD-10-CM | POA: Diagnosis not present

## 2015-10-12 DIAGNOSIS — M9901 Segmental and somatic dysfunction of cervical region: Secondary | ICD-10-CM | POA: Diagnosis not present

## 2015-10-12 DIAGNOSIS — M9907 Segmental and somatic dysfunction of upper extremity: Secondary | ICD-10-CM | POA: Diagnosis not present

## 2015-10-12 DIAGNOSIS — M5442 Lumbago with sciatica, left side: Secondary | ICD-10-CM | POA: Diagnosis not present

## 2015-10-20 DIAGNOSIS — M9901 Segmental and somatic dysfunction of cervical region: Secondary | ICD-10-CM | POA: Diagnosis not present

## 2015-10-20 DIAGNOSIS — M9903 Segmental and somatic dysfunction of lumbar region: Secondary | ICD-10-CM | POA: Diagnosis not present

## 2015-10-20 DIAGNOSIS — M5442 Lumbago with sciatica, left side: Secondary | ICD-10-CM | POA: Diagnosis not present

## 2015-10-20 DIAGNOSIS — M9907 Segmental and somatic dysfunction of upper extremity: Secondary | ICD-10-CM | POA: Diagnosis not present

## 2015-10-20 DIAGNOSIS — M5412 Radiculopathy, cervical region: Secondary | ICD-10-CM | POA: Diagnosis not present

## 2015-10-20 DIAGNOSIS — M7541 Impingement syndrome of right shoulder: Secondary | ICD-10-CM | POA: Diagnosis not present

## 2015-10-23 DIAGNOSIS — L259 Unspecified contact dermatitis, unspecified cause: Secondary | ICD-10-CM | POA: Diagnosis not present

## 2016-01-16 DIAGNOSIS — Z23 Encounter for immunization: Secondary | ICD-10-CM | POA: Diagnosis not present

## 2016-01-22 DIAGNOSIS — L218 Other seborrheic dermatitis: Secondary | ICD-10-CM | POA: Diagnosis not present

## 2016-01-22 DIAGNOSIS — L918 Other hypertrophic disorders of the skin: Secondary | ICD-10-CM | POA: Diagnosis not present

## 2016-01-22 DIAGNOSIS — D1801 Hemangioma of skin and subcutaneous tissue: Secondary | ICD-10-CM | POA: Diagnosis not present

## 2016-01-22 DIAGNOSIS — L280 Lichen simplex chronicus: Secondary | ICD-10-CM | POA: Diagnosis not present

## 2016-03-01 DIAGNOSIS — K5909 Other constipation: Secondary | ICD-10-CM | POA: Diagnosis not present

## 2016-03-07 DIAGNOSIS — H40012 Open angle with borderline findings, low risk, left eye: Secondary | ICD-10-CM | POA: Diagnosis not present

## 2016-03-07 DIAGNOSIS — H5203 Hypermetropia, bilateral: Secondary | ICD-10-CM | POA: Diagnosis not present

## 2016-03-07 DIAGNOSIS — H2513 Age-related nuclear cataract, bilateral: Secondary | ICD-10-CM | POA: Diagnosis not present

## 2016-03-07 DIAGNOSIS — H40011 Open angle with borderline findings, low risk, right eye: Secondary | ICD-10-CM | POA: Diagnosis not present

## 2016-04-20 DIAGNOSIS — K5900 Constipation, unspecified: Secondary | ICD-10-CM | POA: Diagnosis not present

## 2016-04-20 DIAGNOSIS — R194 Change in bowel habit: Secondary | ICD-10-CM | POA: Diagnosis not present

## 2016-04-20 DIAGNOSIS — R1030 Lower abdominal pain, unspecified: Secondary | ICD-10-CM | POA: Diagnosis not present

## 2016-05-04 DIAGNOSIS — K5900 Constipation, unspecified: Secondary | ICD-10-CM | POA: Diagnosis not present

## 2016-05-04 DIAGNOSIS — R143 Flatulence: Secondary | ICD-10-CM | POA: Diagnosis not present

## 2016-08-18 DIAGNOSIS — R0789 Other chest pain: Secondary | ICD-10-CM | POA: Diagnosis not present

## 2016-08-18 DIAGNOSIS — R002 Palpitations: Secondary | ICD-10-CM | POA: Diagnosis not present

## 2016-08-19 ENCOUNTER — Encounter (HOSPITAL_COMMUNITY): Payer: Self-pay | Admitting: *Deleted

## 2016-08-19 ENCOUNTER — Emergency Department (HOSPITAL_COMMUNITY): Payer: Medicare Other

## 2016-08-19 ENCOUNTER — Emergency Department (HOSPITAL_COMMUNITY)
Admission: EM | Admit: 2016-08-19 | Discharge: 2016-08-19 | Disposition: A | Discharge status: Home / Self Care | Payer: Medicare Other | Attending: Emergency Medicine | Admitting: Emergency Medicine

## 2016-08-19 DIAGNOSIS — R0789 Other chest pain: Secondary | ICD-10-CM | POA: Diagnosis not present

## 2016-08-19 DIAGNOSIS — Z7982 Long term (current) use of aspirin: Secondary | ICD-10-CM | POA: Insufficient documentation

## 2016-08-19 DIAGNOSIS — I2510 Atherosclerotic heart disease of native coronary artery without angina pectoris: Secondary | ICD-10-CM | POA: Diagnosis not present

## 2016-08-19 DIAGNOSIS — R000 Tachycardia, unspecified: Secondary | ICD-10-CM | POA: Diagnosis not present

## 2016-08-19 DIAGNOSIS — R079 Chest pain, unspecified: Secondary | ICD-10-CM | POA: Diagnosis not present

## 2016-08-19 DIAGNOSIS — Z79899 Other long term (current) drug therapy: Secondary | ICD-10-CM | POA: Insufficient documentation

## 2016-08-19 HISTORY — DX: Gastro-esophageal reflux disease without esophagitis: K21.9

## 2016-08-19 LAB — BASIC METABOLIC PANEL
Anion gap: 7 (ref 5–15)
BUN: 12 mg/dL (ref 6–20)
CO2: 28 mmol/L (ref 22–32)
Calcium: 9.7 mg/dL (ref 8.9–10.3)
Chloride: 102 mmol/L (ref 101–111)
Creatinine, Ser: 0.9 mg/dL (ref 0.61–1.24)
GFR calc Af Amer: >60 mL/min (ref >60)
GFR calc non Af Amer: >60 mL/min (ref >60)
Glucose, Bld: 104 mg/dL — ABNORMAL HIGH (ref 65–99)
Potassium: 4.1 mmol/L (ref 3.5–5.1)
Sodium: 137 mmol/L (ref 135–145)

## 2016-08-19 LAB — CBC
HCT: 40.3 % (ref 39.0–52.0)
Hemoglobin: 13.7 g/dL (ref 13.0–17.0)
MCH: 30.5 pg (ref 26.0–34.0)
MCHC: 34 g/dL (ref 30.0–36.0)
MCV: 89.8 fL (ref 78.0–100.0)
Platelets: 212 10*3/uL (ref 150–400)
RBC: 4.49 MIL/uL (ref 4.22–5.81)
RDW: 13.1 % (ref 11.5–15.5)
WBC: 5.9 10*3/uL (ref 4.0–10.5)

## 2016-08-19 LAB — I-STAT TROPONIN, ED
Troponin i, poc: 0 ng/mL (ref 0.00–0.08)
Troponin i, poc: 0 ng/mL (ref 0.00–0.08)

## 2016-08-19 NOTE — ED Triage Notes (Addendum)
Pt arrived by [facility]. Reports mowing his yard and after mild exertion had onset of mid chest pressure and noted that heart was racing approx 190/min. Pt reported being clammy and diaphoretic, had multiple episodes of same over past few weeks. Iv started pta.

## 2016-08-19 NOTE — ED Provider Notes (Signed)
[facility] DEPT Provider Note   CSN: [id number] Arrival date & time: 08/19/16  1836     History   Chief Complaint Chief Complaint  Patient presents with  . Chest Pain    HPI Devin Rosales is a 71 y.o. male.  The history is provided by the patient.  Chest Pain   This is a new problem. The current episode started 3 to 5 hours ago. The problem occurs constantly. The problem has been gradually improving. The pain is associated with exertion. The pain is present in the substernal region. The pain is at a severity of 5/10. The pain is moderate. The quality of the pain is described as exertional. The pain does not radiate. The symptoms are aggravated by exertion. Associated symptoms include diaphoresis, exertional chest pressure, irregular heartbeat and shortness of breath. Pertinent negatives include no abdominal pain, no back pain, no claudication, no cough, no dizziness, no fever, no headaches, no hemoptysis, no leg pain, no lower extremity edema, no malaise/fatigue, no nausea, no near-syncope, no numbness, no orthopnea, no palpitations, no PND, no sputum production, no syncope, no vomiting and no weakness. He has tried antacids for the symptoms. The treatment provided mild relief. Risk factors include male gender and being elderly.  Pertinent negatives for past medical history include no CAD, no PE and no seizures.  Procedure history is negative for cardiac catheterization.    Past Medical History:  Diagnosis Date  . GERD (gastroesophageal reflux disease)   . Hypercholesterolemia   . Irritable bowel syndrome     Patient Active Problem List   Diagnosis Date Noted  . Hypercholesterolemia     Past Surgical History:  Procedure Laterality Date  . APPENDECTOMY    . HERNIA REPAIR    . TONSILLECTOMY    . TREATMENT FISTULA ANAL         Home Medications    Prior to Admission medications   Medication Sig Start Date End Date Taking? Authorizing Provider  aspirin EC 325 MG  tablet Take 325-650 mg by mouth once as needed (for chest pain).   Yes Historical Provider, MD  aspirin EC 81 MG tablet Take 81 mg by mouth at bedtime.    Yes Historical Provider, MD  Cholecalciferol (VITAMIN D-3 PO) Take 1 capsule by mouth every morning.   Yes Historical Provider, MD  Coenzyme Q10 (COQ10 PO) Take 1 capsule by mouth daily.    Yes Historical Provider, MD  DENTA 5000 PLUS 1.1 % CREA dental cream Place 1 application onto teeth daily.  06/23/16  Yes Historical Provider, MD  esomeprazole (NEXIUM 24HR) 20 MG capsule Take 20 mg by mouth 2 (two) times daily.   Yes Historical Provider, MD  fluticasone (FLONASE) 50 MCG/ACT nasal spray Place 2 sprays into both nostrils daily as needed for allergies or rhinitis.    Yes Historical Provider, MD  Multiple Vitamin (MULTIVITAMIN WITH MINERALS) TABS tablet Take 1 tablet by mouth every morning.    Yes Historical Provider, MD  pravastatin (PRAVACHOL) 40 MG tablet Take 40 mg by mouth at bedtime.    Yes Historical Provider, MD    Family History History reviewed. No pertinent family history.  Social History Social History  Substance Use Topics  . Smoking status: Never Smoker  . Smokeless tobacco: Not on file  . Alcohol use Yes     Comment: 4 beers, 2 glasses of wine weekly     Allergies   Patient has no known allergies.   Review of Systems Review of Systems  Constitutional: Positive for diaphoresis. Negative for chills, fever and malaise/fatigue.  HENT: Negative for ear pain and sore throat.   Eyes: Negative for pain and visual disturbance.  Respiratory: Positive for chest tightness and shortness of breath. Negative for cough, hemoptysis and sputum production.   Cardiovascular: Positive for chest pain. Negative for palpitations, orthopnea, claudication, syncope, PND and near-syncope.  Gastrointestinal: Negative for abdominal pain, nausea and vomiting.  Genitourinary: Negative for dysuria and hematuria.  Musculoskeletal: Negative for  arthralgias and back pain.  Skin: Negative for color change and rash.  Neurological: Negative for dizziness, seizures, syncope, weakness, numbness and headaches.  All other systems reviewed and are negative.    Physical Exam Updated Vital Signs BP 115/76   Pulse 71   Temp 97.5 F (36.4 C) (Oral)   Resp 18   SpO2 96%   Physical Exam  Constitutional: He appears well-developed and well-nourished.  HENT:  Head: Normocephalic and atraumatic.  Eyes: Conjunctivae and EOM are normal.  Neck: Neck supple.  Cardiovascular: Normal rate, regular rhythm and intact distal pulses.   No murmur heard. Pulmonary/Chest: Effort normal and breath sounds normal. No respiratory distress.  Abdominal: Soft. There is no tenderness.  Musculoskeletal: He exhibits no edema.  Neurological: He is alert. No cranial nerve deficit or sensory deficit. GCS eye subscore is 4. GCS verbal subscore is 5. GCS motor subscore is 6.  Skin: Skin is warm and dry.     Psychiatric: He has a normal mood and affect. His speech is normal.  Nursing note and vitals reviewed.    ED Treatments / Results  Labs (all labs ordered are listed, but only abnormal results are displayed) Labs Reviewed  BASIC METABOLIC PANEL - Abnormal; Notable for the following:       Result Value   Glucose, Bld 104 (*)    All other components within normal limits  CBC  I-STAT TROPOININ, ED  Randolm Idol, ED    EKG  EKG Interpretation None       Radiology Dg Chest 2 View  Result Date: 08/19/2016 CLINICAL DATA:  Mid chest pressure with mild exertion.  Tachycardia. EXAM: CHEST  2 VIEW COMPARISON:  07/07/2013 FINDINGS: The heart size and mediastinal contours are within normal limits. Both lungs are clear. No pulmonary edema, effusion or pneumothorax. The visualized skeletal structures are unremarkable. IMPRESSION: No active cardiopulmonary disease. Electronically Signed   By: Ashley Royalty M.D.   On: 08/19/2016 19:16     Procedures Procedures (including critical care time)  Medications Ordered in ED Medications - No data to display   Initial Impression / Assessment and Plan / ED Course  I have reviewed the triage vital signs and the nursing notes.  Pertinent labs & imaging results that were available during my care of the patient were reviewed by me and considered in my medical decision making (see chart for details).     71 year old male with history of hyperlipidemia presents in the setting of palpitations, chest pressure, shortness of breath. Patient portion last several weeks she's had intermittent short runs of palpitations with mild chest pressure. He reports these have come at different times with no obvious onset. Patient reports he's been able to exercise during this time without competitions and does exercise daily. He reports he was cutting grass today when he had a another event and on his phone it recorded that his heart rate was 190 bpm. Patient reports that this lasted longer than normal and for this he came to the emergency  department.  On arrival in emergency Department patient was hemodynamic stable and afebrile. Patient reports event has completely ceased at this time he has no symptoms. EKG reveals normal sinus rhythm no signs of acute ischemia. Troponin 2 is negative. Electrolytes within normal limits. No signs of anemia or elevation in white blood cell count. Chest x-ray without significant abnormality. I had long discussion with patient and his symptoms seem to be related to paroxysmal arrhythmia.  Low suspicion for ACS, PE, other acute emergent pathology. This does not appear to be onset with exertion or obvious etiology. Patient has already discussed these issues with PCP and has pre-existing plan to obtain Holter monitor within the next week. At this time of bleed patient would be safe for discharge home with close follow-up with cardiology and plan for outpatient Holter monitor.  Strict return precautions were given and patient was stable at time of discharge. Patient and family in agreement with plan.  Final Clinical Impressions(s) / ED Diagnoses   Final diagnoses:  Tachycardia  Chest pressure    New Prescriptions New Prescriptions   No medications on file     Esaw Grandchild, MD 08/19/16 2346    Virgel Manifold, MD 08/24/16 [phone]

## 2016-08-19 NOTE — ED Notes (Signed)
Pt did not respond when called for room 

## 2016-08-23 ENCOUNTER — Ambulatory Visit (INDEPENDENT_AMBULATORY_CARE_PROVIDER_SITE_OTHER): Payer: Medicare Other | Admitting: Cardiology

## 2016-08-23 ENCOUNTER — Encounter: Payer: Self-pay | Admitting: Cardiology

## 2016-08-23 ENCOUNTER — Encounter (INDEPENDENT_AMBULATORY_CARE_PROVIDER_SITE_OTHER): Payer: Self-pay

## 2016-08-23 VITALS — BP 115/62 | HR 60 | Ht 65.5 in | Wt 142.4 lb

## 2016-08-23 DIAGNOSIS — R002 Palpitations: Secondary | ICD-10-CM | POA: Diagnosis not present

## 2016-08-23 DIAGNOSIS — R079 Chest pain, unspecified: Secondary | ICD-10-CM

## 2016-08-23 NOTE — Patient Instructions (Addendum)
Medication Instructions:  The current medical regimen is effective;  continue present plan and medications.  Testing/Procedures: Your physician has recommended that you wear an event monitor for 30 days. Event monitors are medical devices that record the heart's electrical activity. Doctors most often [place] these monitors to diagnose arrhythmias. Arrhythmias are problems with the speed or rhythm of the heartbeat. The monitor is a small, portable device. You can wear one while you do your normal daily activities. This is usually used to diagnose what is causing palpitations/syncope (passing out).  Your physician has requested that you have an exercise tolerance test. For further information please visit HugeFiesta.[email or web address]. Please also follow instruction sheet, as given.  Follow-Up: Follow up in 6 weeks with Dr Marlou Porch.  Thank you for choosing [facility]!!

## 2016-08-23 NOTE — Progress Notes (Signed)
Cardiology Office Note:    Date:  08/23/2016   ID:  Devin Rosales, DOB 23-Nov-1945, MRN [id number]  PCP:  Irven Shelling, MD  Cardiologist:  Candee Furbish, MD    Referring MD: Lavone Orn, MD     History of Present Illness:    Devin Rosales is a 71 y.o. male here for evaluation of chest pain after being seen in the emergency department at the request of Dr. Lavone Orn his internist.  I reviewed Dr. Delene Ruffini note from 08/18/16. He was skiing about a month ago he had episodes where he felt like his heart was beating very hard, fast. He had been waking up nightly with a vibration feeling in his chest. Strange sensation in his throat at times. Sometimes it'll last for 5 minutes then resolved. He still exercises very strenuously without any shortness of breath, chest pain.  He has been allergic to many statins. In his past mental history is noted that he has had nonobstructive coronary artery disease. He also has a history notable bowel syndrome as well.  Nexium was started by Dr. Laurann Montana.  Friends with Dr. Mare Ferrari at church. Pushing mower and walking he noted that his heart rate seemed to take off quite quickly for 5-10 minutes. Sometimes he feels a strange sensation down the center of his chest wall, he thought it was esophageal. He has been monitoring his heart rate with his apple watch. He showed me a couple tracings where his heart rate was 1 92 bpm. Usually when he is cycling he achieves 130-140 beats a minute. He is convinced though that he is feeling his heart racing when the apple watch is detecting his increased heart rate.  Back several years ago he had a cardiac catheterization done by Dr. Tollie Eth after a nuclear stress test was false positive in the inferior wall. Your members having a 30% lesion in his RCA for PDA is how he describes it.  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 Enjoys road cycling. He has a church  member at [employer].    Past Medical History:  Diagnosis Date  . BPH (benign prostatic hyperplasia)   . CAD (coronary artery disease)   . Esophageal spasm   . Fast heart beat   . GERD (gastroesophageal reflux disease)   . Hx of insomnia   . Hx of vertigo    BENIGN POSITIONAL  . Hypercholesterolemia   . Hyperlipidemia   . Irritable bowel syndrome   . Oral herpes simplex infection   . Prostatitis 1998  . Seasonal allergic rhinitis   . Spermatocele    RIGHT    Past Surgical History:  Procedure Laterality Date  . APPENDECTOMY    . HERNIA REPAIR    . TONSILLECTOMY    . TREATMENT FISTULA ANAL      Current Medications: Current Meds  Medication Sig  . aspirin EC 81 MG tablet Take 81 mg by mouth at bedtime.   . Cholecalciferol (VITAMIN D-3 PO) Take 1 capsule by mouth every morning.  . Coenzyme Q10 (COQ10 PO) Take 1 capsule by mouth daily.   . DENTA 5000 PLUS 1.1 % CREA dental cream Place 1 application onto teeth daily.   Marland Kitchen esomeprazole (NEXIUM 24HR) 20 MG capsule Take 20 mg by mouth 2 (two) times daily.  . fluocinonide (LIDEX) 0.05 % external solution Apply 1 application topically 2 (two) times daily as needed.  . fluticasone (FLONASE) 50 MCG/ACT nasal spray Place 2  sprays into both nostrils daily as needed for allergies or rhinitis.   . magnesium citrate SOLN Take 1 Bottle by mouth daily as needed for severe constipation.  . Multiple Vitamin (MULTIVITAMIN WITH MINERALS) TABS tablet Take 1 tablet by mouth every morning.   . pravastatin (PRAVACHOL) 40 MG tablet Take 40 mg by mouth at bedtime.   . valACYclovir (VALTREX) 1000 MG tablet Take 2,000 mg by mouth daily as needed.     Allergies:   Crestor [rosuvastatin calcium]; Lipitor [atorvastatin]; Vytorin [ezetimibe-simvastatin]; and Zocor [simvastatin]   Social History   Social History  . Marital status: Married    Spouse name: N/A  . Number of children: N/A  . Years of education: N/A   Social History Main Topics  .  Smoking status: Never Smoker  . Smokeless tobacco: Never Used  . Alcohol use Yes     Comment: 4 beers, 2 glasses of wine weekly  . Drug use: No  . Sexual activity: Yes     Comment: MARRIED   Other Topics Concern  . None   Social History Narrative  . None     No early family history of coronary artery disease ROS:   Please see the history of present illness.     All other systems reviewed and are negative.   EKGs/Labs/Other Studies Reviewed:    EKG:  EKG is  ordered today.  The ekg ordered today demonstrates 08/23/16-sinus rhythm 65 with no other abnormalities personally viewed-prior EKG were personally reviewed from 07/08/15 showing sinus rhythm with no other abnormalities.  Recent Labs: 08/19/2016: BUN 12; Creatinine, Ser 0.90; Hemoglobin 13.7; Platelets 212; Potassium 4.1; Sodium 137   Recent Lipid Panel No results found for: CHOL, TRIG, HDL, CHOLHDL, VLDL, LDLCALC, LDLDIRECT   Hemoglobin 14.4, platelets 223, TSH 1.02, normal, LDL 100, HDL 53  Physical Exam:    VS:  BP 115/62 (BP Location: Left Arm)   Pulse 60   Ht 5' 5.5" (1.664 m)   Wt 142 lb 6.4 oz (64.6 kg)   BMI 23.34 kg/m     Wt Readings from Last 3 Encounters:  08/23/16 142 lb 6.4 oz (64.6 kg)  07/07/13 142 lb (64.4 kg)     GEN:  Thin, well developed in no acute distress HEENT: Normal NECK: No JVD; No carotid bruits LYMPHATICS: No lymphadenopathy CARDIAC: RRR, no murmurs, rubs, gallops RESPIRATORY:  Clear to auscultation without rales, wheezing or rhonchi  ABDOMEN: Soft, non-tender, non-distended MUSCULOSKELETAL:  No edema; No deformity  SKIN: Warm and dry NEUROLOGIC:  Alert and oriented x 3 PSYCHIATRIC:  Normal affect   ASSESSMENT:    1. Palpitations   2. Chest pain, unspecified type    PLAN:    In order of problems listed above:  Palpitations  - I will order a 30 day event monitor to ensure that we capture these arrhythmias and action. He is concerned about the possibility of atrial  fibrillation and the possible need for anticoagulation. I would like to get a diagnosis first. If his heart rate truly was 192 bpm this could be a reentrant tachycardia as well. We shall see. For now, continue with his low-dose aspirin.  Atypical chest discomfort  - Previous cardiac catheterization reassuring with nonobstructive CAD, 30% lesion.  - I will order a exercise treadmill test to see if any of his arrhythmias are triggered into see if there is any evidence of ischemia.  - He is very active, road cycling, does not experience any anginal symptoms during those  activities. He does Nordic track at home without any problems.  - Currently prescribed Nexium.  We will see him back in about 6 weeks to discuss results.  Medication Adjustments/Labs and Tests Ordered: Current medicines are reviewed at length with the patient today.  Concerns regarding medicines are outlined above. Labs and tests ordered and medication changes are outlined in the patient instructions below:  Patient Instructions  Medication Instructions:  The current medical regimen is effective;  continue present plan and medications.  Testing/Procedures: Your physician has recommended that you wear an event monitor for 30 days. Event monitors are medical devices that record the heart's electrical activity. Doctors most often [place] these monitors to diagnose arrhythmias. Arrhythmias are problems with the speed or rhythm of the heartbeat. The monitor is a small, portable device. You can wear one while you do your normal daily activities. This is usually used to diagnose what is causing palpitations/syncope (passing out).  Your physician has requested that you have an exercise tolerance test. For further information please visit HugeFiesta.[email or web address]. Please also follow instruction sheet, as given.  Follow-Up: Follow up in 6 weeks with Dr Marlou Porch.  Thank you for choosing [facility]!!        Signed, Candee Furbish, MD    08/23/2016 12:05 PM    [place]

## 2016-08-25 ENCOUNTER — Ambulatory Visit (INDEPENDENT_AMBULATORY_CARE_PROVIDER_SITE_OTHER): Payer: Medicare Other

## 2016-08-25 ENCOUNTER — Telehealth: Payer: Self-pay | Admitting: Cardiology

## 2016-08-25 DIAGNOSIS — R002 Palpitations: Secondary | ICD-10-CM

## 2016-08-25 DIAGNOSIS — R079 Chest pain, unspecified: Secondary | ICD-10-CM

## 2016-08-25 DIAGNOSIS — I4891 Unspecified atrial fibrillation: Secondary | ICD-10-CM

## 2016-08-25 LAB — EXERCISE TOLERANCE TEST
CHL CUP MPHR: 150 {beats}/min
CHL CUP RESTING HR STRESS: 72 {beats}/min
CHL RATE OF PERCEIVED EXERTION: 15
CSEPEDS: 0 s
Estimated workload: 11.7 METS
Exercise duration (min): 10 min
Peak HR: 153 {beats}/min
Percent HR: 102 %

## 2016-08-25 MED ORDER — METOPROLOL TARTRATE 25 MG PO TABS: 12.5000 mg | ORAL_TABLET | Freq: Two times a day (BID) | ORAL | 1 refills | Status: DC

## 2016-08-25 MED ORDER — APIXABAN 5 MG PO TABS: 5.0000 mg | ORAL_TABLET | Freq: Two times a day (BID) | ORAL | 1 refills | Status: DC

## 2016-08-25 NOTE — Telephone Encounter (Addendum)
Received notification from [place] of new onset atrial fib. Strips faxed to office. Episode at 3:45pm clearly atrial fib HR 130s. Episode after 6pm looks to be atrial flutter versus SVT versus sinus tach HR reported at 170. Strips will also be faxed to office for review. CHADSVASC 2 for age, CAD. Reviewed strips with Dr. Harl Bowie as well. Recommend start metoprolol 12.5mg  BID and apixaban 5mg  BID. Spoke with patient - he's not sure whether he felt these or not, did feel some unusual sensation in chest earlier. Long discussion about NOACs versus Coumadin; pt agrees to start Eliquis. Bleeding risks reviewed. Encouraged pt to continue to wear monitor to track HR response.  Will forward to Dr. Marlou Porch regarding further workup - he references Dr. Delene Ruffini recent [place] note which I do not have access to. Would consider obtaining TSH and 2D Echocardiogram if not already done. Dr. Marlou Porch to review whether or not to discontinue aspirin since we are starting Eliquis. Elta Guadeloupe, please forward further recs to your nurse to enact. Thanks!   Sadarius Norman PA-C

## 2016-08-26 ENCOUNTER — Other Ambulatory Visit: Payer: Self-pay

## 2016-08-26 ENCOUNTER — Other Ambulatory Visit: Payer: Medicare Other | Admitting: *Deleted

## 2016-08-26 DIAGNOSIS — I480 Paroxysmal atrial fibrillation: Secondary | ICD-10-CM | POA: Diagnosis not present

## 2016-08-26 LAB — TSH: TSH: 3.51 u[IU]/mL (ref 0.450–4.500)

## 2016-08-26 LAB — T4, FREE: Free T4: 1.16 ng/dL (ref 0.82–1.77)

## 2016-08-26 NOTE — Telephone Encounter (Signed)
Order ECHO Order TSH free T4 Dx. AFIB (discovered on monitor--Thanks Dayna)  Thanks. Make sure he has follow up in the next few weeks.   Candee Furbish, MD

## 2016-08-26 NOTE — Telephone Encounter (Signed)
PT AWARE OF  TESTING RECOMMENDATIONS AND  IS  AGREEABLE  IS HAVING  LAB TODAY  AND  ECHO TO BE SCHEDULED

## 2016-08-26 NOTE — Addendum Note (Signed)
Addended by: Eulis Foster on: 08/26/2016 11:06 AM   Modules accepted: Orders

## 2016-08-29 ENCOUNTER — Telehealth: Payer: Self-pay | Admitting: Cardiology

## 2016-08-29 NOTE — Telephone Encounter (Signed)
Patient aware of results and recommendations.  Patient aware echo has been ordered and will be sent to scheduling.

## 2016-08-29 NOTE — Telephone Encounter (Signed)
Patient is returning your call,thanks. °

## 2016-09-06 DIAGNOSIS — I2510 Atherosclerotic heart disease of native coronary artery without angina pectoris: Secondary | ICD-10-CM | POA: Diagnosis not present

## 2016-09-06 DIAGNOSIS — Z0000 Encounter for general adult medical examination without abnormal findings: Secondary | ICD-10-CM | POA: Diagnosis not present

## 2016-09-06 DIAGNOSIS — Z125 Encounter for screening for malignant neoplasm of prostate: Secondary | ICD-10-CM | POA: Diagnosis not present

## 2016-09-06 DIAGNOSIS — I480 Paroxysmal atrial fibrillation: Secondary | ICD-10-CM | POA: Diagnosis not present

## 2016-09-06 DIAGNOSIS — Z1389 Encounter for screening for other disorder: Secondary | ICD-10-CM | POA: Diagnosis not present

## 2016-09-06 DIAGNOSIS — K219 Gastro-esophageal reflux disease without esophagitis: Secondary | ICD-10-CM | POA: Diagnosis not present

## 2016-09-14 ENCOUNTER — Ambulatory Visit (HOSPITAL_COMMUNITY): Payer: Medicare Other | Attending: Cardiovascular Disease

## 2016-09-14 ENCOUNTER — Other Ambulatory Visit: Payer: Self-pay

## 2016-09-14 DIAGNOSIS — I420 Dilated cardiomyopathy: Secondary | ICD-10-CM | POA: Insufficient documentation

## 2016-09-14 DIAGNOSIS — I4891 Unspecified atrial fibrillation: Secondary | ICD-10-CM

## 2016-09-14 DIAGNOSIS — I061 Rheumatic aortic insufficiency: Secondary | ICD-10-CM | POA: Diagnosis not present

## 2016-09-14 DIAGNOSIS — I051 Rheumatic mitral insufficiency: Secondary | ICD-10-CM | POA: Diagnosis not present

## 2016-09-14 DIAGNOSIS — I5030 Unspecified diastolic (congestive) heart failure: Secondary | ICD-10-CM | POA: Insufficient documentation

## 2016-10-06 ENCOUNTER — Ambulatory Visit (INDEPENDENT_AMBULATORY_CARE_PROVIDER_SITE_OTHER): Payer: Medicare Other | Admitting: Cardiology

## 2016-10-06 ENCOUNTER — Encounter: Payer: Self-pay | Admitting: Cardiology

## 2016-10-06 VITALS — BP 112/80 | HR 66 | Ht 65.0 in | Wt 142.6 lb

## 2016-10-06 DIAGNOSIS — I4891 Unspecified atrial fibrillation: Secondary | ICD-10-CM | POA: Diagnosis not present

## 2016-10-06 DIAGNOSIS — R079 Chest pain, unspecified: Secondary | ICD-10-CM | POA: Diagnosis not present

## 2016-10-06 DIAGNOSIS — R002 Palpitations: Secondary | ICD-10-CM | POA: Diagnosis not present

## 2016-10-06 MED ORDER — APIXABAN 5 MG PO TABS
5.0000 mg | ORAL_TABLET | Freq: Two times a day (BID) | ORAL | 0 refills | Status: DC
Start: 2016-10-06 — End: 2018-05-04

## 2016-10-06 MED ORDER — DILTIAZEM HCL ER COATED BEADS 120 MG PO CP24: 120.0000 mg | ORAL_CAPSULE | Freq: Every day | ORAL | 3 refills | Status: DC

## 2016-10-06 NOTE — Patient Instructions (Signed)
Medication Instructions:  Please stop your ASA and Metoprolol. Start Diltiazem 120 mg a day. Continue all other medications as listed.  Follow-Up: Follow up in 6 months with Dr. Marlou Porch.  You will receive a letter in the mail 2 months before you are due.  Please call us when you receive this letter to schedule your follow up appointment.  If you need a refill on your cardiac medications before your next appointment, please call your pharmacy.  Thank you for choosing [facility]!!

## 2016-10-06 NOTE — Progress Notes (Signed)
Cardiology Office Note:    Date:  10/06/2016   ID:  Devin Rosales, DOB 1945/11/22, MRN [id number]  PCP:  Lavone Orn, MD  Cardiologist:  Candee Furbish, MD    Referring MD: Lavone Orn, MD     History of Present Illness:    Devin Rosales is a 71 y.o. male here for evaluation of chest pain after being seen in the emergency department at the request of Dr. Lavone Orn his internist.  I reviewed Dr. Delene Ruffini note from 08/18/16. He was skiing about a month ago he had episodes where he felt like his heart was beating very hard, fast. He had been waking up nightly with a vibration feeling in his chest. Strange sensation in his throat at times. Sometimes it'll last for 5 minutes then resolved. He still exercises very strenuously without any shortness of breath, chest pain.  He has been allergic to many statins. In his past mental history is noted that he has had nonobstructive coronary artery disease. He also has a history notable bowel syndrome as well.  Nexium was started by Dr. Laurann Montana.  Friends with Dr. Mare Ferrari at church. Pushing mower and walking he noted that his heart rate seemed to take off quite quickly for 5-10 minutes. Sometimes he feels a strange sensation down the center of his chest wall, he thought it was esophageal. He has been monitoring his heart rate with his apple watch. He showed me a couple tracings where his heart rate was 1 92 bpm. Usually when he is cycling he achieves 130-140 beats a minute. He is convinced though that he is feeling his heart racing when the apple watch is detecting his increased heart rate.  Back several years ago he had a cardiac catheterization done by Dr. Tollie Eth after a nuclear stress test was false positive in the inferior wall. Your members having a 30% lesion in his RCA for PDA is how he describes it.  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 Enjoys road cycling. He has a church  member at [employer].  10/06/16-event monitor demonstrated atrial fibrillation, short duration. Metoprolol low-dose was started as well as Eliquis. He feels better. No bleeding. Lab work reassuring. No syncope, no orthopnea.  Past Medical History:  Diagnosis Date  . BPH (benign prostatic hyperplasia)   . CAD (coronary artery disease)   . Esophageal spasm   . Fast heart beat   . GERD (gastroesophageal reflux disease)   . Hx of insomnia   . Hx of vertigo    BENIGN POSITIONAL  . Hypercholesterolemia   . Hyperlipidemia   . Irritable bowel syndrome   . Oral herpes simplex infection   . Prostatitis 1998  . Seasonal allergic rhinitis   . Spermatocele    RIGHT    Past Surgical History:  Procedure Laterality Date  . APPENDECTOMY    . HERNIA REPAIR    . TONSILLECTOMY    . TREATMENT FISTULA ANAL      Current Medications: Current Meds  Medication Sig  . apixaban (ELIQUIS) 5 MG TABS tablet Take 1 tablet (5 mg total) by mouth 2 (two) times daily.  . Coenzyme Q10 (COQ10 PO) Take 1 capsule by mouth daily.   . DENTA 5000 PLUS 1.1 % CREA dental cream Place 1 application onto teeth daily.   . fluocinonide (LIDEX) 0.05 % external solution Apply 1 application topically 2 (two) times daily as needed.  . fluticasone (FLONASE) 50 MCG/ACT nasal spray Place  2 sprays into both nostrils daily as needed for allergies or rhinitis.   . magnesium citrate SOLN Take 1 Bottle by mouth daily as needed for severe constipation.  . Multiple Vitamin (MULTIVITAMIN WITH MINERALS) TABS tablet Take 1 tablet by mouth every morning.   . pravastatin (PRAVACHOL) 40 MG tablet Take 40 mg by mouth at bedtime.   . valACYclovir (VALTREX) 1000 MG tablet Take 2,000 mg by mouth daily as needed (fever blister outbreak).   . [DISCONTINUED] apixaban (ELIQUIS) 5 MG TABS tablet Take 1 tablet (5 mg total) by mouth 2 (two) times daily.  . [DISCONTINUED] aspirin EC 81 MG tablet Take 81 mg by mouth at bedtime.   . [DISCONTINUED]  metoprolol tartrate (LOPRESSOR) 25 MG tablet Take 0.5 tablets (12.5 mg total) by mouth 2 (two) times daily.     Allergies:   Crestor [rosuvastatin calcium]; Lipitor [atorvastatin]; Vytorin [ezetimibe-simvastatin]; and Zocor [simvastatin]   Social History   Social History  . Marital status: Married    Spouse name: N/A  . Number of children: N/A  . Years of education: N/A   Social History Main Topics  . Smoking status: Never Smoker  . Smokeless tobacco: Never Used  . Alcohol use Yes     Comment: 4 beers, 2 glasses of wine weekly  . Drug use: No  . Sexual activity: Yes     Comment: MARRIED   Other Topics Concern  . None   Social History Narrative  . None     No early family history of coronary artery disease ROS:   Please see the history of present illness.     All other systems reviewed and are negative.   EKGs/Labs/Other Studies Reviewed:    EKG:  EKG is  ordered today.  The ekg ordered today demonstrates 08/23/16-sinus rhythm 65 with no other abnormalities personally viewed-prior EKG were personally reviewed from 07/08/15 showing sinus rhythm with no other abnormalities.  Recent Labs: 08/19/2016: BUN 12; Creatinine, Ser 0.90; Hemoglobin 13.7; Platelets 212; Potassium 4.1; Sodium 137 08/26/2016: TSH 3.510   Recent Lipid Panel No results found for: CHOL, TRIG, HDL, CHOLHDL, VLDL, LDLCALC, LDLDIRECT   Hemoglobin 14.4, platelets 223, TSH 1.02, normal, LDL 100, HDL 53  ECHO - Left ventricle: The cavity size was normal. Systolic function was   vigorous. The estimated ejection fraction was in the range of 65%   to 70%. Wall motion was normal; there were no regional wall   motion abnormalities. Doppler parameters are consistent with   abnormal left ventricular relaxation (grade 1 diastolic   dysfunction). - Aortic valve: There was mild regurgitation. - Aorta: Aortic root dimension: 39 mm (ED). - Ascending aorta: The ascending aorta was mildly dilated. - Mitral valve:  There was mild regurgitation.   Physical Exam:    VS:  BP 112/80   Pulse 66   Ht 5\' 5"  (1.651 m)   Wt 142 lb 9.6 oz (64.7 kg)   BMI 23.73 kg/m     Wt Readings from Last 3 Encounters:  10/06/16 142 lb 9.6 oz (64.7 kg)  08/23/16 142 lb 6.4 oz (64.6 kg)  07/07/13 142 lb (64.4 kg)     GEN: Well nourished, well developed, in no acute distress  HEENT: normal  Neck: no JVD, carotid bruits, or masses Cardiac: RRR; no murmurs, rubs, or gallops,no edema  Respiratory:  clear to auscultation bilaterally, normal work of breathing GI: soft, nontender, nondistended, + BS MS: no deformity or atrophy  Skin: warm and dry, no rash  Neuro:  Alert and Oriented x 3, Strength and sensation are intact Psych: euthymic mood, full affect   ASSESSMENT:    1. Atrial fibrillation, unspecified type (HCC)   2. Palpitations   3. Chest pain, unspecified type    PLAN:    In order of problems listed above:  AFIB parox   - Short duration of atrial fibrillation was noted on event monitor. This correlated with symptoms. We started Eliquis 5 mg twice a day. Low-dose metoprolol was also utilized 12.5 mg twice a day. We have decided to change him to diltiazem CD 120 QD  Atypical chest discomfort  - Previous cardiac catheterization reassuring with nonobstructive CAD, 30% lesion. Treadmill test was reassuring.  - He is very active, road cycling, does not experience any anginal symptoms during those activities. He does Nordic track at home without any problems.  - Currently prescribed Nexium.  We will see him back in about 6 months  Medication Adjustments/Labs and Tests Ordered: Current medicines are reviewed at length with the patient today.  Concerns regarding medicines are outlined above. Labs and tests ordered and medication changes are outlined in the patient instructions below:  Patient Instructions  Medication Instructions:  Please stop your ASA and Metoprolol. Start Diltiazem 120 mg a  day. Continue all other medications as listed.  Follow-Up: Follow up in 6 months with Dr. Marlou Porch.  You will receive a letter in the mail 2 months before you are due.  Please call us when you receive this letter to schedule your follow up appointment.  If you need a refill on your cardiac medications before your next appointment, please call your pharmacy.  Thank you for choosing [facility]!!        Signed, Candee Furbish, MD  10/06/2016 4:43 PM    [place]

## 2016-11-02 ENCOUNTER — Other Ambulatory Visit: Payer: Self-pay | Admitting: Physician Assistant

## 2016-11-02 NOTE — Telephone Encounter (Signed)
Request received for Eliquis 5mg ; pt is 71 yrs old, wt-64.7kg, Crea-0.90 on 08/19/16, last seen by Dr. Marlou Porch on 10/06/16 & is due to follow up in 6 months with Dr. Marlou Porch per progress note. Will send in refill request to requested Pharmacy.

## 2016-11-30 ENCOUNTER — Encounter (HOSPITAL_COMMUNITY): Payer: Self-pay

## 2016-11-30 ENCOUNTER — Telehealth: Payer: Self-pay | Admitting: Cardiology

## 2016-11-30 ENCOUNTER — Emergency Department (HOSPITAL_COMMUNITY): Payer: Medicare Other

## 2016-11-30 DIAGNOSIS — R079 Chest pain, unspecified: Secondary | ICD-10-CM | POA: Insufficient documentation

## 2016-11-30 DIAGNOSIS — R000 Tachycardia, unspecified: Secondary | ICD-10-CM | POA: Diagnosis present

## 2016-11-30 DIAGNOSIS — Z7901 Long term (current) use of anticoagulants: Secondary | ICD-10-CM | POA: Insufficient documentation

## 2016-11-30 DIAGNOSIS — Z79899 Other long term (current) drug therapy: Secondary | ICD-10-CM | POA: Diagnosis not present

## 2016-11-30 DIAGNOSIS — I2510 Atherosclerotic heart disease of native coronary artery without angina pectoris: Secondary | ICD-10-CM | POA: Diagnosis not present

## 2016-11-30 DIAGNOSIS — I480 Paroxysmal atrial fibrillation: Secondary | ICD-10-CM | POA: Diagnosis not present

## 2016-11-30 LAB — BASIC METABOLIC PANEL
Anion gap: 8 (ref 5–15)
BUN: 12 mg/dL (ref 6–20)
CALCIUM: 9.3 mg/dL (ref 8.9–10.3)
CO2: 25 mmol/L (ref 22–32)
CREATININE: 0.86 mg/dL (ref 0.61–1.24)
Chloride: 101 mmol/L (ref 101–111)
GFR calc Af Amer: >60 mL/min (ref >60)
Glucose, Bld: 93 mg/dL (ref 65–99)
POTASSIUM: 3.9 mmol/L (ref 3.5–5.1)
SODIUM: 134 mmol/L — AB (ref 135–145)

## 2016-11-30 LAB — CBC
HEMATOCRIT: 37.9 % — AB (ref 39.0–52.0)
Hemoglobin: 12.9 g/dL — ABNORMAL LOW (ref 13.0–17.0)
MCH: 30.4 pg (ref 26.0–34.0)
MCHC: 34 g/dL (ref 30.0–36.0)
MCV: 89.4 fL (ref 78.0–100.0)
PLATELETS: 237 10*3/uL (ref 150–400)
RBC: 4.24 MIL/uL (ref 4.22–5.81)
RDW: 13.3 % (ref 11.5–15.5)
WBC: 6.6 10*3/uL (ref 4.0–10.5)

## 2016-11-30 LAB — I-STAT TROPONIN, ED: TROPONIN I, POC: 0 ng/mL (ref 0.00–0.08)

## 2016-11-30 NOTE — Telephone Encounter (Signed)
EPIC down when spoke with pt.  Unable to access chart at time of phone conversation.  Pt states that he went biking today and HR jumped up to 150-157 and did not come back down on its own.  Pt took a Diltiazem and 2 hrs later, HR finally returned to normal.  When he returned from bike ride he noticed that he had some discomfort in his chest.  This has yet to resolve.  No BP available.  Pt states that for the last week or so he has noticed his HR jumping up much higher than usual when exerting and has noticed occasional discomfort at other times but usually resolves quickly.  He states he can go out into his yard to do simple yard work and it just jumps up.  Denies SOB, lightheadedness or dizziness. Advised pt he should proceed to ER for further evaluation since discomfort has not resolved with this episode.  Pt verbalized understanding and was in agreement with this plan.

## 2016-11-30 NOTE — ED Triage Notes (Signed)
Pt endorses central chest pressure that began this morning after a bike ride along with tachycardia in the 160s. Pt laid down afterward and it did not go away. Pt rested for a while and then called his cardiologist and was sent here. VSS. Pt has hx of a-fib and is on diltiazem and eliquis.

## 2016-12-01 ENCOUNTER — Emergency Department (HOSPITAL_COMMUNITY)
Admission: EM | Admit: 2016-12-01 | Discharge: 2016-12-01 | Disposition: A | Discharge status: Home / Self Care | Payer: Medicare Other | Attending: Emergency Medicine | Admitting: Emergency Medicine

## 2016-12-01 DIAGNOSIS — I480 Paroxysmal atrial fibrillation: Secondary | ICD-10-CM

## 2016-12-01 LAB — I-STAT TROPONIN, ED: TROPONIN I, POC: 0 ng/mL (ref 0.00–0.08)

## 2016-12-01 NOTE — ED Provider Notes (Signed)
[facility] DEPT Provider Note   CSN: [id number] Arrival date & time: 11/30/16  1759     History   Chief Complaint Chief Complaint  Patient presents with  . Tachycardia  . Chest Pain    HPI Devin Rosales is a 71 y.o. male.  HPI Patient is a 71 year old male with a history of paroxysmal atrial fibrillation on Eliquiswho presents the emergency department after developing sustained heart rates in the 160s.  He has associated palpitations and fullness in his chest and "upper esophagus".  No radiation of his pain.  No shortness of breath.  This occurred while he was riding his bike.when he slowed on his bike his heart rate remained in the 160s and remained like this for sometime after stopping.  Eventually resolved.  He called his friend who is a cardiologist who recommended that he come to the ER for evaluation given his active chest discomfort. 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  They symptomatically at this time.    Past Medical History:  Diagnosis Date  . BPH (benign prostatic hyperplasia)   . CAD (coronary artery disease)   . Esophageal spasm   . Fast heart beat   . GERD (gastroesophageal reflux disease)   . Hx of insomnia   . Hx of vertigo    BENIGN POSITIONAL  . Hypercholesterolemia   . Hyperlipidemia   . Irritable bowel syndrome   . Oral herpes simplex infection   . Prostatitis 1998  . Seasonal allergic rhinitis   . Spermatocele    RIGHT    Patient Active Problem List   Diagnosis Date Noted  . Hypercholesterolemia     Past Surgical History:  Procedure Laterality Date  . APPENDECTOMY    . HERNIA REPAIR    . TONSILLECTOMY    . TREATMENT FISTULA ANAL         Home Medications    Prior to Admission medications   Medication Sig Start Date End Date Taking? Authorizing Provider   apixaban (ELIQUIS) 5 MG TABS tablet Take 1 tablet (5 mg total) by mouth 2 (two) times daily. 10/06/16  Yes Jerline Pain, MD  Coenzyme Q10 (COQ10 PO) Take 1 capsule by mouth daily.    Yes [provider]  diltiazem (CARDIZEM CD) 120 MG 24 hr capsule Take 1 capsule (120 mg total) by mouth daily. 10/06/16 01/04/17 Yes Jerline Pain, MD  Multiple Vitamin (MULTIVITAMIN WITH MINERALS) TABS tablet Take 1 tablet by mouth every morning.    Yes [provider]  pravastatin (PRAVACHOL) 40 MG tablet Take 40 mg by mouth at bedtime.    Yes [provider]  ELIQUIS 5 MG TABS tablet TAKE 1 TABLET(5 MG) BY MOUTH TWICE DAILY Patient not taking: Reported on 12/01/2016 11/02/16   Jerline Pain, MD    Family History History reviewed. No pertinent family history.  Social History Social History  Substance Use Topics  . Smoking status: Never Smoker  . Smokeless tobacco: Never Used  . Alcohol use Yes     Comment: 4 beers, 2 glasses of wine weekly     Allergies   Crestor [rosuvastatin calcium]; Lipitor [atorvastatin]; Vytorin [ezetimibe-simvastatin]; and Zocor [simvastatin]   Review of Systems Review of Systems  All other systems reviewed and are negative.    Physical Exam Updated Vital Signs BP (!) 124/56  Pulse (!) 118   Temp 97.7 F (36.5 C) (Oral)   Resp 20   Ht 5\' 5"  (1.651 m)   Wt 64.9 kg (143 lb)   SpO2 100%   BMI 23.80 kg/m   Physical Exam  Constitutional: He is oriented to person, place, and time. He appears well-developed and well-nourished.  HENT:  Head: Normocephalic and atraumatic.  Eyes: EOM are normal.  Neck: Normal range of motion.  Cardiovascular: Normal rate, regular rhythm, normal heart sounds and intact distal pulses.   Pulmonary/Chest: Effort normal and breath sounds normal. No respiratory distress.  Abdominal: Soft. He exhibits no distension. There is no tenderness.  Musculoskeletal: Normal range of motion.  Neurological: He is alert and  oriented to person, place, and time.  Skin: Skin is warm and dry.  Psychiatric: He has a normal mood and affect. Judgment normal.  Nursing note and vitals reviewed.    ED Treatments / Results  Labs (all labs ordered are listed, but only abnormal results are displayed) Labs Reviewed  BASIC METABOLIC PANEL - Abnormal; Notable for the following:       Result Value   Sodium 134 (*)    All other components within normal limits  CBC - Abnormal; Notable for the following:    Hemoglobin 12.9 (*)    HCT 37.9 (*)    All other components within normal limits  I-STAT TROPONIN, ED  I-STAT TROPONIN, ED    EKG  EKG Interpretation  Date/Time:  Wednesday November 30 2016 18:13:29 EDT Ventricular Rate:  57 PR Interval:  186 QRS Duration: 80 QT Interval:  362 QTC Calculation: 352 R Axis:   48 Text Interpretation:  Sinus bradycardia with Premature supraventricular complexes Otherwise normal ECG No significant change was found Confirmed by Jola Schmidt [phone]) on 12/01/2016 4:48:18 AM       Radiology Dg Chest 2 View  Result Date: 11/30/2016 CLINICAL DATA:  Chest pain EXAM: CHEST  2 VIEW COMPARISON:  08/19/2016 FINDINGS: Normal heart size. Lungs clear. No pneumothorax. No pleural effusion. IMPRESSION: No active cardiopulmonary disease. Electronically Signed   By: Marybelle Killings M.D.   On: 11/30/2016 19:01    Procedures Procedures (including critical care time)  Medications Ordered in ED Medications - No data to display   Initial Impression / Assessment and Plan / ED Course  I have reviewed the triage vital signs and the nursing notes.  Pertinent labs & imaging results that were available during my care of the patient were reviewed by me and considered in my medical decision making (see chart for details).     Asymptomatic at this time.  Troponin negative 2.  EKG without ischemic changes.  Patient normal sinus rhythm with occasional PVCs.I'll send a electronic medical record message to  his cardiologist to contact him tomorrow with additional recommendations.  He may benefit from increasing his calcium channel blocker and may benefit from referral to electrophysiology. ?rhythm control  Final Clinical Impressions(s) / ED Diagnoses   Final diagnoses:  Paroxysmal atrial fibrillation with rapid ventricular response [facility])    New Prescriptions New Prescriptions   No medications on file     Jola Schmidt, MD 12/01/16 [phone]

## 2016-12-05 ENCOUNTER — Telehealth: Payer: Self-pay

## 2016-12-05 DIAGNOSIS — I480 Paroxysmal atrial fibrillation: Secondary | ICD-10-CM

## 2016-12-05 MED ORDER — DILTIAZEM HCL 30 MG PO TABS: 30.0000 mg | ORAL_TABLET | Freq: Every day | ORAL | 1 refills | Status: DC | PRN

## 2016-12-05 NOTE — Telephone Encounter (Signed)
Rx(s) sent to pharmacy electronically. Referral ordered.

## 2016-12-05 NOTE — Progress Notes (Signed)
I called Devin Rosales on the phone and we talked about the symptoms he has recently been experiencing. He has palpitations related to rapid ventricular response. These tend to occur primarily when he exercises outside (rides his bike or walks the dog) and are much less prominent when he exercises indoors. So far he has been managed primary with a rate control strategy. Metoprolol 12.5 mg twice daily did not satisfactory control his complaints, diltiazem sustained release 120 mg once daily is not much more successful. Management with rate control is limited by resting bradycardia, likely related to excellent physical fitness, as well as a normal/low blood pressure. It may be necessary to consider a rhythm control strategy with either ablation or antiarrhythmic medications. Devin Rosales has requested that I make a referral to one of our electrophysiologists, an option that he has already discussed with Dr. Marlou Porch at the previous office appointment. For the time being we have decided to give him an extra 30 mg of diltiazem to use as needed for rapid palpitations during exercise. Also advised that he hydrate himself aggressively, especially when playing outdoor exercise. He can eat a relatively sodium enriched diet to avoid low blood pressure. He will call us back in several days after he has tried this new approach.  Sanda Klein, MD, [facility] [phone] office [phone] pager

## 2016-12-05 NOTE — Telephone Encounter (Signed)
-----   Message from Sanda Klein, MD sent at 12/05/2016 12:23 PM EDT ----- Please call in immediate release diltiazem 30 mg to take as needed for rapid breakthrough palpitations. #30, RF1 and refer to EP [facility] or [facility].  [facility]

## 2016-12-09 NOTE — Telephone Encounter (Signed)
Thank you for the update Sharna Gabrys, MD  

## 2016-12-14 ENCOUNTER — Encounter: Payer: Self-pay | Admitting: Cardiology

## 2017-01-04 ENCOUNTER — Encounter: Payer: Self-pay | Admitting: Cardiology

## 2017-01-04 ENCOUNTER — Ambulatory Visit (INDEPENDENT_AMBULATORY_CARE_PROVIDER_SITE_OTHER): Payer: Medicare Other | Admitting: Cardiology

## 2017-01-04 VITALS — BP 98/68 | HR 70 | Ht 65.0 in | Wt 142.8 lb

## 2017-01-04 DIAGNOSIS — E782 Mixed hyperlipidemia: Secondary | ICD-10-CM

## 2017-01-04 DIAGNOSIS — I480 Paroxysmal atrial fibrillation: Secondary | ICD-10-CM

## 2017-01-04 NOTE — Progress Notes (Signed)
Electrophysiology Office Note   Date:  01/04/2017   ID:  Devin Rosales, DOB 01/03/1946, MRN [id number]  PCP:  Lavone Orn, MD  Cardiologist:  Marlou Porch Primary Electrophysiologist:  Eder Macek Meredith Leeds, MD    Chief Complaint  Patient presents with  . Advice Only    PAF     History of Present Illness: Devin Rosales is a 71 y.o. male who is being seen today for the evaluation of atrial fibrillation at the request of Lavone Orn, MD. Presenting today for electrophysiology evaluation. He was diagnosed with atrial fibrillation in the spring of 2018. He noted that when he was skiing, his heart was beating very fast. He had waking up at night feeling vibrations in his chest as well as strange sensations in his throat. Since then when exercising, such as pushing a lawnmower, his heart would take off quickly for 5-10 minutes. He felt a strain sensation in the center of his chest. His apical wall showed heart rates at 192 bpm at times. Usually when he is likely he achieved heart rates in the 130s to 140s.  He presented to the emergency room on 12/01/16 with palpitations, found to have a heart rate in the 160s. He converted to sinus rhythm while in the emergency room.  Today, he denies symptoms of palpitations, chest pain, shortness of breath, orthopnea, PND, lower extremity edema, claudication, dizziness, presyncope, syncope, bleeding, or neurologic sequela. The patient is tolerating medications without difficulties.    Past Medical History:  Diagnosis Date  . BPH (benign prostatic hyperplasia)   . CAD (coronary artery disease)   . Esophageal spasm   . Fast heart beat   . GERD (gastroesophageal reflux disease)   . Hx of insomnia   . Hx of vertigo    BENIGN POSITIONAL  . Hypercholesterolemia   . Hyperlipidemia   . Irritable bowel syndrome   . Oral herpes simplex infection   . Prostatitis 1998  . Seasonal allergic rhinitis   . Spermatocele    RIGHT   Past Surgical History:  Procedure  Laterality Date  . APPENDECTOMY    . HERNIA REPAIR    . TONSILLECTOMY    . TREATMENT FISTULA ANAL       Current Outpatient Prescriptions  Medication Sig Dispense Refill  . apixaban (ELIQUIS) 5 MG TABS tablet Take 1 tablet (5 mg total) by mouth 2 (two) times daily. 60 tablet 0  . Coenzyme Q10 (COQ10 PO) Take 1 capsule by mouth daily.     Marland Kitchen diltiazem (CARDIZEM CD) 120 MG 24 hr capsule Take 1 capsule (120 mg total) by mouth daily. 90 capsule 3  . diltiazem (CARDIZEM) 30 MG tablet Take 1 tablet (30 mg total) by mouth daily as needed (for rapid breakthrough palpitations). 30 tablet 1  . Multiple Vitamin (MULTIVITAMIN WITH MINERALS) TABS tablet Take 1 tablet by mouth every morning.     . pravastatin (PRAVACHOL) 40 MG tablet Take 40 mg by mouth at bedtime.     . valGANciclovir (VALCYTE) 450 MG tablet Take 450 mg by mouth as needed. Fever blister     No current facility-administered medications for this visit.     Allergies:   Crestor [rosuvastatin calcium]; Lipitor [atorvastatin]; Vytorin [ezetimibe-simvastatin]; and Zocor [simvastatin]   Social History:  The patient  reports that he has never smoked. He has never used smokeless tobacco. He reports that he drinks alcohol. He reports that he does not use drugs.   Family History:  The patient's family history is not  on file.    ROS:  Please see the history of present illness.   Otherwise, review of systems is positive for Constipation.   All other systems are reviewed and negative.    PHYSICAL EXAM: VS:  BP 98/68   Pulse 70   Ht 5\' 5"  (1.651 m)   Wt 142 lb 12.8 oz (64.8 kg)   BMI 23.76 kg/m  , BMI Body mass index is 23.76 kg/m. GEN: Well nourished, well developed, in no acute distress  HEENT: normal  Neck: no JVD, carotid bruits, or masses Cardiac: RRR; no murmurs, rubs, or gallops,no edema  Respiratory:  clear to auscultation bilaterally, normal work of breathing GI: soft, nontender, nondistended, + BS MS: no deformity or atrophy   Skin: warm and dry Neuro:  Strength and sensation are intact Psych: euthymic mood, full affect  EKG:  EKG is not ordered today. Personal review of the ekg ordered 12/01/16 shows SR, APCs, rate 57  Recent Labs: 08/26/2016: TSH 3.510 11/30/2016: BUN 12; Creatinine, Ser 0.86; Hemoglobin 12.9; Platelets 237; Potassium 3.9; Sodium 134    Lipid Panel  No results found for: CHOL, TRIG, HDL, CHOLHDL, VLDL, LDLCALC, LDLDIRECT   Wt Readings from Last 3 Encounters:  01/04/17 142 lb 12.8 oz (64.8 kg)  11/30/16 143 lb (64.9 kg)  10/06/16 142 lb 9.6 oz (64.7 kg)      Other studies Reviewed: Additional studies/ records that were reviewed today include: TTE 09/14/16  Review of the above records today demonstrates:  - Left ventricle: The cavity size was normal. Systolic function was   vigorous. The estimated ejection fraction was in the range of 65%   to 70%. Wall motion was normal; there were no regional wall   motion abnormalities. Doppler parameters are consistent with   abnormal left ventricular relaxation (grade 1 diastolic   dysfunction). - Aortic valve: There was mild regurgitation. - Aorta: Aortic root dimension: 39 mm (ED). - Ascending aorta: The ascending aorta was mildly dilated. - Mitral valve: There was mild regurgitation.  ETT 08/25/16 - personally reviewed  Blood pressure demonstrated a normal response to exercise.  There was no ST segment deviation noted during stress.  Clinically and electrically negative for ischemia Excellent exercise tolerance.  Monitor 10/09/16 - personally reviewed  Parox AFIB detected (3%), occasional tachycardia.  Post conversion pauses noted (none >3 seconds)   ASSESSMENT AND PLAN:  1.  Paroxysmal atrial fibrillation: on Eliquis. Currently in sinus rhythm not having issues. He is on diltiazem tolerating medications well. We'll make no changes at this time.  This patients CHA2DS2-VASc Score and unadjusted Ischemic Stroke Rate (% per year) is  equal to 2.2 % stroke rate/year from a score of 2  Above score calculated as 1 point each if present [CHF, HTN, DM, Vascular=MI/PAD/Aortic Plaque, Age if 65-74, or Male] Above score calculated as 2 points each if present [Age > 75, or Stroke/TIA/TE]  2. Hyperlipidemia: Continue diltiazem  3. Coronary artery disease: Apparently has a 30% RCA lesion. No current chest pain. No changes.  Current medicines are reviewed at length with the patient today.   The patient does not have concerns regarding his medicines.  The following changes were made today:  none  Labs/ tests ordered today include:  No orders of the defined types were placed in this encounter.    Disposition:   FU with Chirag Krueger 3 months  Signed, Chanya Chrisley Meredith Leeds, MD  01/04/2017 10:46 AM     [facility]  [address] [place] [address] [phone] (office) [phone] (fax)

## 2017-01-04 NOTE — Patient Instructions (Signed)
Medication Instructions:    Your physician recommends that you continue on your current medications as directed. Please refer to the Current Medication list given to you today.  - If you need a refill on your cardiac medications before your next appointment, please call your pharmacy.   Labwork:  None ordered  Testing/Procedures:  None ordered  Follow-Up:  Your physician recommends that you schedule a follow-up appointment in: 3 months with Dr. Camnitz.  Thank you for choosing [facility]!!   Rainbow Salman, RN [phone]         

## 2017-02-10 DIAGNOSIS — Z23 Encounter for immunization: Secondary | ICD-10-CM | POA: Diagnosis not present

## 2017-03-07 DIAGNOSIS — H5203 Hypermetropia, bilateral: Secondary | ICD-10-CM | POA: Diagnosis not present

## 2017-03-07 DIAGNOSIS — H2513 Age-related nuclear cataract, bilateral: Secondary | ICD-10-CM | POA: Diagnosis not present

## 2017-04-06 DIAGNOSIS — J0100 Acute maxillary sinusitis, unspecified: Secondary | ICD-10-CM | POA: Diagnosis not present

## 2017-04-10 DIAGNOSIS — N419 Inflammatory disease of prostate, unspecified: Secondary | ICD-10-CM | POA: Diagnosis not present

## 2017-04-12 ENCOUNTER — Encounter: Payer: Self-pay | Admitting: Cardiology

## 2017-04-12 ENCOUNTER — Ambulatory Visit (INDEPENDENT_AMBULATORY_CARE_PROVIDER_SITE_OTHER): Payer: Medicare Other | Admitting: Cardiology

## 2017-04-12 VITALS — BP 112/74 | HR 84 | Ht 65.0 in | Wt 142.0 lb

## 2017-04-12 DIAGNOSIS — I480 Paroxysmal atrial fibrillation: Secondary | ICD-10-CM | POA: Diagnosis not present

## 2017-04-12 DIAGNOSIS — E785 Hyperlipidemia, unspecified: Secondary | ICD-10-CM

## 2017-04-12 DIAGNOSIS — I2510 Atherosclerotic heart disease of native coronary artery without angina pectoris: Secondary | ICD-10-CM | POA: Diagnosis not present

## 2017-04-12 NOTE — Patient Instructions (Signed)
Medication Instructions:  Your physician recommends that you continue on your current medications as directed. Please refer to the Current Medication list given to you today.  If you need a refill on your cardiac medications before your next appointment, please call your pharmacy.   Labwork: None ordered  Testing/Procedures: None ordered  Follow-Up: Your physician wants you to follow-up in: 6 months with Dr. Camnitz.  You will receive a reminder letter in the mail two months in advance. If you don't receive a letter, please call our office to schedule the follow-up appointment.  Thank you for choosing [facility]!!   Nancylee Gaines, RN [phone]         

## 2017-04-12 NOTE — Progress Notes (Signed)
Electrophysiology Office Note   Date:  04/12/2017   ID:  Devin Rosales, DOB 20-Jun-1945, MRN [id number]  PCP:  Devin Orn, MD  Cardiologist:  Devin Rosales Primary Electrophysiologist:  Devin Vath Meredith Leeds, MD    Chief Complaint  Patient presents with  . Follow-up    PAF   History of Present Illness: Devin Rosales is a 71 y.o. male who is being seen today for the evaluation of atrial fibrillation at the request of Devin Orn, MD. Presenting today for electrophysiology evaluation. He was diagnosed with atrial fibrillation in the spring of 2018. He noted that when he was skiing, his heart was beating very fast. He had waking up at night feeling vibrations in his chest as well as strange sensations in his throat. Since then when exercising, such as pushing a lawnmower, his heart would take off quickly for 5-10 minutes.   He presented to the emergency room on 12/01/16 with palpitations, found to have a heart rate in the 160s. He converted to sinus rhythm while in the emergency room.  Today, denies symptoms of palpitations, chest pain, shortness of breath, orthopnea, PND, lower extremity edema, claudication, dizziness, presyncope, syncope, bleeding, or neurologic sequela. The patient is tolerating medications without difficulties.  He is currently feeling well without issue.  He recently had a an upper respiratory infection.  This occurred when he was on a ski trip in [place] at the beginning of the month.  He is also planning a ski trip in January to [place].   Past Medical History:  Diagnosis Date  . BPH (benign prostatic hyperplasia)   . CAD (coronary artery disease)   . Esophageal spasm   . Fast heart beat   . GERD (gastroesophageal reflux disease)   . Hx of insomnia   . Hx of vertigo    BENIGN POSITIONAL  . Hypercholesterolemia   . Hyperlipidemia   . Irritable bowel syndrome   . Oral herpes simplex infection   . Prostatitis 1998  . Seasonal allergic rhinitis   . Spermatocele    RIGHT   Past Surgical History:  Procedure Laterality Date  . APPENDECTOMY    . HERNIA REPAIR    . TONSILLECTOMY    . TREATMENT FISTULA ANAL       Current Outpatient Medications  Medication Sig Dispense Refill  . apixaban (ELIQUIS) 5 MG TABS tablet Take 1 tablet (5 mg total) by mouth 2 (two) times daily. 60 tablet 0  . ciprofloxacin (CIPRO) 500 MG tablet Take 500 mg by mouth 2 (two) times daily.  0  . Coenzyme Q10 (COQ10 PO) Take 1 capsule by mouth daily.     Marland Kitchen diltiazem (CARDIZEM) 30 MG tablet Take 1 tablet (30 mg total) by mouth daily as needed (for rapid breakthrough palpitations). 30 tablet 1  . Multiple Vitamin (MULTIVITAMIN WITH MINERALS) TABS tablet Take 1 tablet by mouth every morning.     . pravastatin (PRAVACHOL) 40 MG tablet Take 40 mg by mouth at bedtime.     . tamsulosin (FLOMAX) 0.4 MG CAPS capsule Take 0.4 mg by mouth daily.  0  . valGANciclovir (VALCYTE) 450 MG tablet Take 450 mg by mouth as needed. Fever blister    . diltiazem (CARDIZEM CD) 120 MG 24 hr capsule Take 1 capsule (120 mg total) by mouth daily. 90 capsule 3   No current facility-administered medications for this visit.     Allergies:   Crestor [rosuvastatin calcium]; Lipitor [atorvastatin]; Vytorin [ezetimibe-simvastatin]; and Zocor [simvastatin]   Social History:  The patient  reports that  has never smoked. he has never used smokeless tobacco. He reports that he drinks alcohol. He reports that he does not use drugs.   Family History:  The patient's family history includes Breast cancer in his mother; Hypertension in his brother; Prostate cancer in his brother.   ROS:  Please see the history of present illness.   Otherwise, review of systems is positive for nose, cough, wheezing.   All other systems are reviewed and negative.   PHYSICAL EXAM: VS:  BP 112/74   Pulse 84   Ht 5\' 5"  (1.651 m)   Wt 142 lb (64.4 kg)   BMI 23.63 kg/m  , BMI Body mass index is 23.63 kg/m. GEN: Well nourished, well  developed, in no acute distress  HEENT: normal  Neck: no JVD, carotid bruits, or masses Cardiac: RRR; no murmurs, rubs, or gallops,no edema  Respiratory:  clear to auscultation bilaterally, normal work of breathing GI: soft, nontender, nondistended, + BS MS: no deformity or atrophy  Skin: warm and dry Neuro:  Strength and sensation are intact Psych: euthymic mood, full affect  EKG:  EKG is not ordered today. Personal review of the ekg ordered 11/30/16 shows SR, APCs   Recent Labs: 08/26/2016: TSH 3.510 11/30/2016: BUN 12; Creatinine, Ser 0.86; Hemoglobin 12.9; Platelets 237; Potassium 3.9; Sodium 134    Lipid Panel  No results found for: CHOL, TRIG, HDL, CHOLHDL, VLDL, LDLCALC, LDLDIRECT   Wt Readings from Last 3 Encounters:  04/12/17 142 lb (64.4 kg)  01/04/17 142 lb 12.8 oz (64.8 kg)  11/30/16 143 lb (64.9 kg)      Other studies Reviewed: Additional studies/ records that were reviewed today include: TTE 09/14/16  Review of the above records today demonstrates:  - Left ventricle: The cavity size was normal. Systolic function was   vigorous. The estimated ejection fraction was in the range of 65%   to 70%. Wall motion was normal; there were no regional wall   motion abnormalities. Doppler parameters are consistent with   abnormal left ventricular relaxation (grade 1 diastolic   dysfunction). - Aortic valve: There was mild regurgitation. - Aorta: Aortic root dimension: 39 mm (ED). - Ascending aorta: The ascending aorta was mildly dilated. - Mitral valve: There was mild regurgitation.  ETT 08/25/16 - personally reviewed  Blood pressure demonstrated a normal response to exercise.  There was no ST segment deviation noted during stress.  Clinically and electrically negative for ischemia Excellent exercise tolerance.  Monitor 10/09/16 - personally reviewed  Parox AFIB detected (3%), occasional tachycardia.  Post conversion pauses noted (none >3 seconds)   ASSESSMENT AND  PLAN:  1.  Paroxysmal atrial fibrillation: On Eliquis.  He remains in sinus rhythm.  Doing well with diltiazem and as needed diltiazem.  He has not had any further episodes of atrial This patients CHA2DS2-VASc Score and unadjusted Ischemic Stroke Rate (% per year) is equal to 3.2 % stroke rate/year from a score of 3  Above score calculated as 1 point each if present [CHF, HTN, DM, Vascular=MI/PAD/Aortic Plaque, Age if 65-74, or Male] Above score calculated as 2 points each if present [Age > 75, or Stroke/TIA/TE]  2. Hyperlipidemia: Continue pravastatin  3. Coronary artery disease: Per report has a 30% RCA lesion.  No current chest pain.  No changes.  Current medicines are reviewed at length with the patient today.   The patient does not have concerns regarding his medicines.  The following changes were made today:  None  Labs/ tests ordered today include:  No orders of the defined types were placed in this encounter.    Disposition:   FU with Deovion Batrez 6 months  Signed, Roland Prine Meredith Leeds, MD  04/12/2017 9:26 AM     [facility] [address] [place] [address] [phone] (office) [phone] (fax)

## 2017-04-19 DIAGNOSIS — J309 Allergic rhinitis, unspecified: Secondary | ICD-10-CM | POA: Diagnosis not present

## 2017-04-19 DIAGNOSIS — H6983 Other specified disorders of Eustachian tube, bilateral: Secondary | ICD-10-CM | POA: Diagnosis not present

## 2017-04-19 DIAGNOSIS — H903 Sensorineural hearing loss, bilateral: Secondary | ICD-10-CM | POA: Diagnosis not present

## 2017-04-21 ENCOUNTER — Telehealth (HOSPITAL_COMMUNITY): Payer: Self-pay | Admitting: *Deleted

## 2017-04-21 NOTE — Telephone Encounter (Signed)
Pt called in stating he had gone into afib last night and continues today. His HR is between 70-130s. BP 125/70. Pt start prednisone pack for sinus infection yesterday. Encouraged pt to use PRN cardizem every 4 hours for HR over 100. Educated on afib prevalence with prednisone as well as illness. If afib persists pt will on Monday to be assessed once prednisone pack completed. Pt in agreement with plan.

## 2017-04-25 DIAGNOSIS — I480 Paroxysmal atrial fibrillation: Secondary | ICD-10-CM

## 2017-04-25 HISTORY — DX: Paroxysmal atrial fibrillation: I48.0

## 2017-05-03 ENCOUNTER — Other Ambulatory Visit: Payer: Self-pay | Admitting: Cardiology

## 2017-08-21 ENCOUNTER — Other Ambulatory Visit: Payer: Self-pay | Admitting: Cardiology

## 2017-08-22 NOTE — Telephone Encounter (Signed)
Eliquis 5mg  refill request received; pt is 72 yrs old, wt-64.4kg, Crea-0.86 on 11/30/16, last seen by Dr. Curt Bears on 04/12/17; will send in refill to requested pharmacy.

## 2017-09-13 DIAGNOSIS — N400 Enlarged prostate without lower urinary tract symptoms: Secondary | ICD-10-CM | POA: Diagnosis not present

## 2017-09-13 DIAGNOSIS — I2510 Atherosclerotic heart disease of native coronary artery without angina pectoris: Secondary | ICD-10-CM | POA: Diagnosis not present

## 2017-09-13 DIAGNOSIS — I480 Paroxysmal atrial fibrillation: Secondary | ICD-10-CM | POA: Diagnosis not present

## 2017-09-13 DIAGNOSIS — Z0000 Encounter for general adult medical examination without abnormal findings: Secondary | ICD-10-CM | POA: Diagnosis not present

## 2017-09-13 DIAGNOSIS — Z125 Encounter for screening for malignant neoplasm of prostate: Secondary | ICD-10-CM | POA: Diagnosis not present

## 2017-09-13 DIAGNOSIS — Z1389 Encounter for screening for other disorder: Secondary | ICD-10-CM | POA: Diagnosis not present

## 2017-09-17 ENCOUNTER — Other Ambulatory Visit: Payer: Self-pay | Admitting: Cardiology

## 2017-10-28 ENCOUNTER — Telehealth: Payer: Self-pay | Admitting: Internal Medicine

## 2017-10-28 NOTE — Telephone Encounter (Signed)
Cardiology Moonlighter Note  Received call from patient, noticed his heart rates have been very irregular today. Had low heart rates in the 40's, then some rates in the 140's later. No chest pain, chest pressure, dyspnea. Typically heart rates in the 60's. Now rates back to normal in the 60's. Had some brief lightheadedness when rates were in the 40's. This was transient and self-resolving. No syncope, presyncope. Has been very active lately, riding bike and frequent walks.  Thinks he might be dehydrated from working outside this morning.  Given that the patient's symptoms have now completely resolved and his heart rates are back to normal, I recommended that the patient continue monitoring for symptomatic bradycardia or tachycardia.  He will rehydrate himself and understands that he should come to the ED if he experiences worsening issues with arrhythmia or any new or worrisome symptoms.  A copy of this note will be sent to the patient's cardiologist.  Marcie Mowers, MD Cardiology Fellow, PGY-6

## 2017-11-01 ENCOUNTER — Other Ambulatory Visit: Payer: Self-pay | Admitting: Cardiovascular Disease

## 2017-11-01 ENCOUNTER — Telehealth: Payer: Self-pay | Admitting: Cardiology

## 2017-11-01 NOTE — Telephone Encounter (Signed)
NEW MESSAGE   Concerns of afib after 25 mile bike ride on July 4th. Chest discomfort    1. Are you having CP right now? NO  2. Are you experiencing any other symptoms (ex. SOB, nausea, vomiting, sweating)? NO 3. How long have you been experiencing CP? SINCE July 4TH  4. Is your CP continuous or coming and going? CONTINUOUS DISCOMFORT  5. Have you taken Nitroglycerin? NO ?

## 2017-11-01 NOTE — Telephone Encounter (Signed)
Spoke with pt today who have c/o short episodes of AF since his July 4th bike ride. He states he has been taking his 30mg  Diltiazem for breakthrough AF episodes and they have helped. Pt denies being in sustained AF for > a couple of hours. Today he states he is in normal rhythm. I told pt it sounds like he is managing his symptoms/episodes well, however if he begins to have trouble managing AF or finds himself in sustained AF, we can schedule an appointment with the [facility] [address]. For now, pt has an appointment with [facility] on Sept 18. I have advised pt to avoid activities that could exacerbate his AF, stay hydrated and stay cool. Pt agrees with this plan and will call back if he begins to have trouble managing his PAF.

## 2017-12-03 ENCOUNTER — Other Ambulatory Visit: Payer: Self-pay | Admitting: Cardiovascular Disease

## 2017-12-05 ENCOUNTER — Ambulatory Visit (INDEPENDENT_AMBULATORY_CARE_PROVIDER_SITE_OTHER): Payer: Medicare Other | Admitting: Cardiology

## 2017-12-05 ENCOUNTER — Encounter: Payer: Self-pay | Admitting: Cardiology

## 2017-12-05 VITALS — BP 110/72 | HR 75 | Ht 65.0 in | Wt 143.4 lb

## 2017-12-05 DIAGNOSIS — E785 Hyperlipidemia, unspecified: Secondary | ICD-10-CM | POA: Diagnosis not present

## 2017-12-05 DIAGNOSIS — I480 Paroxysmal atrial fibrillation: Secondary | ICD-10-CM

## 2017-12-05 DIAGNOSIS — I2510 Atherosclerotic heart disease of native coronary artery without angina pectoris: Secondary | ICD-10-CM

## 2017-12-05 MED ORDER — DILTIAZEM HCL ER COATED BEADS 180 MG PO CP24: 180.0000 mg | ORAL_CAPSULE | Freq: Every day | ORAL | 3 refills | Status: DC

## 2017-12-05 NOTE — Progress Notes (Signed)
Electrophysiology Office Note   Date:  12/05/2017   ID:  Devin Rosales, DOB Feb 17, 1946, MRN [id number]  PCP:  Lavone Orn, MD  Cardiologist:  Marlou Porch Primary Electrophysiologist:  Devin Friley Meredith Leeds, MD    No chief complaint on file.    History of Present Illness: Devin Rosales is a 72 y.o. male who is being seen today for the evaluation of atrial fibrillation at the request of Lavone Orn, MD. Presenting today for electrophysiology evaluation. He was diagnosed with atrial fibrillation in the spring of 2018. He noted that when he was skiing, his heart was beating very fast. He had waking up at night feeling vibrations in his chest as well as strange sensations in his throat. Since then when exercising, such as pushing a lawnmower, his heart would take off quickly for 5-10 minutes. He felt a strain sensation in the center of his chest. His apical wall showed heart rates at 192 bpm at times. Usually when he is likely he achieved heart rates in the 130s to 140s.  He presented to the emergency room on 12/01/16 with palpitations, found to have a heart rate in the 160s. He converted to sinus rhythm while in the emergency room.  Today, denies symptoms of palpitations, chest pain, shortness of breath, orthopnea, PND, lower extremity edema, claudication, dizziness, presyncope, syncope, bleeding, or neurologic sequela. The patient is tolerating medications without difficulties.  He is doing well today.  He was having previous episodes of rapid atrial fibrillation for about 10 days.  His symptoms are weakness, fatigue, shortness of breath.  He is interested in potential ablation.   Past Medical History:  Diagnosis Date  . BPH (benign prostatic hyperplasia)   . CAD (coronary artery disease)   . Esophageal spasm   . Fast heart beat   . GERD (gastroesophageal reflux disease)   . Hx of insomnia   . Hx of vertigo    BENIGN POSITIONAL  . Hypercholesterolemia   . Hyperlipidemia   . Irritable bowel  syndrome   . Oral herpes simplex infection   . Prostatitis 1998  . Seasonal allergic rhinitis   . Spermatocele    RIGHT   Past Surgical History:  Procedure Laterality Date  . APPENDECTOMY    . HERNIA REPAIR    . TONSILLECTOMY    . TREATMENT FISTULA ANAL       Current Outpatient Medications  Medication Sig Dispense Refill  . apixaban (ELIQUIS) 5 MG TABS tablet Take 1 tablet (5 mg total) by mouth 2 (two) times daily. 60 tablet 0  . CARTIA XT 120 MG 24 hr capsule TAKE 1 CAPSULE(120 MG) BY MOUTH DAILY 90 capsule 1  . ciprofloxacin (CIPRO) 500 MG tablet Take 500 mg by mouth 2 (two) times daily.  0  . Coenzyme Q10 (COQ10 PO) Take 1 capsule by mouth daily.     Marland Kitchen diltiazem (CARDIZEM) 30 MG tablet TAKE 1 TABLET BY MOUTH DAILY AS NEEDED FOR RAPID BREAKTHROUGH PALPITATIONS 30 tablet 0  . ELIQUIS 5 MG TABS tablet TAKE 1 TABLET(5 MG) BY MOUTH TWICE DAILY 60 tablet 5  . Multiple Vitamin (MULTIVITAMIN WITH MINERALS) TABS tablet Take 1 tablet by mouth every morning.     . pravastatin (PRAVACHOL) 40 MG tablet Take 40 mg by mouth at bedtime.     . tamsulosin (FLOMAX) 0.4 MG CAPS capsule Take 0.4 mg by mouth daily.  0  . valGANciclovir (VALCYTE) 450 MG tablet Take 450 mg by mouth as needed. Fever blister  No current facility-administered medications for this visit.     Allergies:   Crestor [rosuvastatin calcium]; Lipitor [atorvastatin]; Vytorin [ezetimibe-simvastatin]; and Zocor [simvastatin]   Social History:  The patient  reports that he has never smoked. He has never used smokeless tobacco. He reports that he drinks alcohol. He reports that he does not use drugs.   Family History:  The patient's family history includes Breast cancer in his mother; Hypertension in his brother; Prostate cancer in his brother.   ROS:  Please see the history of present illness.   Otherwise, review of systems is positive for palpitations.   All other systems are reviewed and negative.   PHYSICAL EXAM: VS:   There were no vitals taken for this visit. , BMI There is no height or weight on file to calculate BMI. GEN: Well nourished, well developed, in no acute distress  HEENT: normal  Neck: no JVD, carotid bruits, or masses Cardiac: RRR; no murmurs, rubs, or gallops,no edema  Respiratory:  clear to auscultation bilaterally, normal work of breathing GI: soft, nontender, nondistended, + BS MS: no deformity or atrophy  Skin: warm and dry Neuro:  Strength and sensation are intact Psych: euthymic mood, full affect  EKG:  EKG is ordered today. Personal review of the ekg ordered shows sinus rhythm, PACs  Recent Labs: No results found for requested labs within last 8760 hours.    Lipid Panel  No results found for: CHOL, TRIG, HDL, CHOLHDL, VLDL, LDLCALC, LDLDIRECT   Wt Readings from Last 3 Encounters:  04/12/17 142 lb (64.4 kg)  01/04/17 142 lb 12.8 oz (64.8 kg)  11/30/16 143 lb (64.9 kg)      Other studies Reviewed: Additional studies/ records that were reviewed today include: TTE 09/14/16  Review of the above records today demonstrates:  - Left ventricle: The cavity size was normal. Systolic function was   vigorous. The estimated ejection fraction was in the range of 65%   to 70%. Wall motion was normal; there were no regional wall   motion abnormalities. Doppler parameters are consistent with   abnormal left ventricular relaxation (grade 1 diastolic   dysfunction). - Aortic valve: There was mild regurgitation. - Aorta: Aortic root dimension: 39 mm (ED). - Ascending aorta: The ascending aorta was mildly dilated. - Mitral valve: There was mild regurgitation.  ETT 08/25/16 - personally reviewed  Blood pressure demonstrated a normal response to exercise.  There was no ST segment deviation noted during stress.  Clinically and electrically negative for ischemia Excellent exercise tolerance.  Monitor 10/09/16 - personally reviewed  Parox AFIB detected (3%), occasional  tachycardia.  Post conversion pauses noted (none >3 seconds)   ASSESSMENT AND PLAN:  1.  Paroxysmal atrial fibrillation: On Eliquis and diltiazem.  He is having more episodes of atrial fibrillation and thus Devin Rosales increase his diltiazem dose to 180 mg daily.  I talked to him about the possibility of ablation.  Risks and benefits were discussed include bleeding, tamponade, heart block, stroke, damage surrounding organs.  The patient understands risks and would like to further consider his options.  He Devin Rosales call us back with an answer.  This patients CHA2DS2-VASc Score and unadjusted Ischemic Stroke Rate (% per year) is equal to 2.2 % stroke rate/year from a score of 2  Above score calculated as 1 point each if present [CHF, HTN, DM, Vascular=MI/PAD/Aortic Plaque, Age if 65-74, or Male] Above score calculated as 2 points each if present [Age > 75, or Stroke/TIA/TE]   2. Hyperlipidemia:  Continue pravastatin  3. Coronary artery disease: 30% RCA lesion.  No chest pain.  No changes.  Current medicines are reviewed at length with the patient today.   The patient does not have concerns regarding his medicines.  The following changes were made today: Increase diltiazem  Labs/ tests ordered today include:  No orders of the defined types were placed in this encounter.    Disposition:   FU with Pheng Prokop 6 months  Signed, Devin Salinger Meredith Leeds, MD  12/05/2017 8:55 AM     [facility] [address] [place] [address] [phone] (office) [phone] (fax)

## 2017-12-05 NOTE — Patient Instructions (Addendum)
Medication Instructions:  Your physician has recommended you make the following change in your medication:  1. INCREASE Diltiazem to 180 mg once daily  * If you need a refill on your cardiac medications before your next appointment, please call your pharmacy.   Labwork: None ordered   Testing/Procedures: None ordered  Follow-Up: Your physician wants you to follow-up in: 6 months with Dr. Curt Bears.  You will receive a reminder letter in the mail two months in advance. If you don't receive a letter, please call our office to schedule the follow-up appointment.  *Please note that any paperwork needing to be filled out by the provider will need to be addressed at the front desk prior to seeing the provider. Please note that any FMLA, disability or other documents regarding health condition is subject to a $25.00 charge that must be received prior to completion of paperwork in the form of a money order or check.  Thank you for choosing [facility]!!   Trinidad Curet, RN [phone]  Any Other Special Instructions Will Be Listed Below (If Applicable). Please call the office if you decide you would like to proceed with ablation  Cardiac Ablation Cardiac ablation is a procedure to disable (ablate) a small amount of heart tissue in very specific places. The heart has many electrical connections. Sometimes these connections are abnormal and can cause the heart to beat very fast or irregularly. Ablating some of the problem areas can improve the heart rhythm or return it to normal. Ablation may be done for people who:  Have Wolff-Parkinson-White syndrome.  Have fast heart rhythms (tachycardia).  Have taken medicines for an abnormal heart rhythm (arrhythmia) that were not effective or caused side effects.  Have a high-risk heartbeat that may be life-threatening.  During the procedure, a small incision is made in the neck or the groin, and a long, thin, flexible tube (catheter) is inserted  into the incision and moved to the heart. Small devices (electrodes) on the tip of the catheter will send out electrical currents. A type of X-ray (fluoroscopy) will be used to help guide the catheter and to provide images of the heart. Tell a health care provider about:  Any allergies you have.  All medicines you are taking, including vitamins, herbs, eye drops, creams, and over-the-counter medicines.  Any problems you or family members have had with anesthetic medicines.  Any blood disorders you have.  Any surgeries you have had.  Any medical conditions you have, such as kidney failure.  Whether you are pregnant or may be pregnant. What are the risks? Generally, this is a safe procedure. However, problems may occur, including:  Infection.  Bruising and bleeding at the catheter insertion site.  Bleeding into the chest, especially into the sac that surrounds the heart. This is a serious complication.  Stroke or blood clots.  Damage to other structures or organs.  Allergic reaction to medicines or dyes.  Need for a permanent pacemaker if the normal electrical system is damaged. A pacemaker is a small computer that sends electrical signals to the heart and helps your heart beat normally.  The procedure not being fully effective. This may not be recognized until months later. Repeat ablation procedures are sometimes required.  What happens before the procedure?  Follow instructions from your health care provider about eating or drinking restrictions.  Ask your health care provider about: ? Changing or stopping your regular medicines. This is especially important if you are taking diabetes medicines or blood thinners. ?  Taking medicines such as aspirin and ibuprofen. These medicines can thin your blood. Do not take these medicines before your procedure if your health care provider instructs you not to.  Plan to have someone take you home from the hospital or clinic.  If you  will be going home right after the procedure, plan to have someone with you for 24 hours. What happens during the procedure?  To lower your risk of infection: ? Your health care team will wash or sanitize their hands. ? Your skin will be washed with soap. ? Hair may be removed from the incision area.  An IV tube will be inserted into one of your veins.  You will be given a medicine to help you relax (sedative).  The skin on your neck or groin will be numbed.  An incision will be made in your neck or your groin.  A needle will be inserted through the incision and into a large vein in your neck or groin.  A catheter will be inserted into the needle and moved to your heart.  Dye may be injected through the catheter to help your surgeon see the area of the heart that needs treatment.  Electrical currents will be sent from the catheter to ablate heart tissue in desired areas. There are three types of energy that may be used to ablate heart tissue: ? Heat (radiofrequency energy). ? Laser energy. ? Extreme cold (cryoablation).  When the necessary tissue has been ablated, the catheter will be removed.  Pressure will be held on the catheter insertion area to prevent excessive bleeding.  A bandage (dressing) will be placed over the catheter insertion area. The procedure may vary among health care providers and hospitals. What happens after the procedure?  Your blood pressure, heart rate, breathing rate, and blood oxygen level will be monitored until the medicines you were given have worn off.  Your catheter insertion area will be monitored for bleeding. You will need to lie still for a few hours to ensure that you do not bleed from the catheter insertion area.  Do not drive for 24 hours or as long as directed by your health care provider. Summary  Cardiac ablation is a procedure to disable (ablate) a small amount of heart tissue in very specific places. Ablating some of the problem  areas can improve the heart rhythm or return it to normal.  During the procedure, electrical currents will be sent from the catheter to ablate heart tissue in desired areas. This information is not intended to replace advice given to you by your health care provider. Make sure you discuss any questions you have with your health care provider. Document Released: 08/28/2008 Document Revised: 02/29/2016 Document Reviewed: 02/29/2016 Elsevier Interactive Patient Education  [employer].

## 2017-12-06 ENCOUNTER — Telehealth: Payer: Self-pay | Admitting: Cardiology

## 2017-12-06 DIAGNOSIS — I480 Paroxysmal atrial fibrillation: Secondary | ICD-10-CM

## 2017-12-06 DIAGNOSIS — Z01812 Encounter for preprocedural laboratory examination: Secondary | ICD-10-CM

## 2017-12-06 NOTE — Telephone Encounter (Signed)
New Message   Pt calling states that he is wanting to get his cardiac ablation schedule

## 2017-12-07 NOTE — Telephone Encounter (Signed)
Pt would like ablation on 9/12. He is aware I will arrange and call him back today or by next Tuesday. Pt agreeable to plan.

## 2017-12-07 NOTE — Telephone Encounter (Signed)
F/u  Pt  Calling to sched his ablation. Please call pt

## 2017-12-12 ENCOUNTER — Encounter: Payer: Self-pay | Admitting: *Deleted

## 2017-12-12 NOTE — Telephone Encounter (Signed)
Pt unable to talk right now.  He and I agreed to try again tomorrow.

## 2017-12-12 NOTE — Telephone Encounter (Signed)
Spoke w/ pt's wife.  Informed that I would send instructions via MyChart.  Asked to have pt call me back to go over instructions.

## 2017-12-14 NOTE — Telephone Encounter (Signed)
Spent 34 min with patient explaining instructions and scheduling post op appts . CT instructions reviewed and sent via MyChart Procedure instructions reviewed and also sent via [facility]. Pre procedure labs scheduled for 9/9. Pt appreciative of taking the time to speak with him and go over instructions thoroughly.

## 2018-01-01 ENCOUNTER — Other Ambulatory Visit: Payer: Medicare Other | Admitting: *Deleted

## 2018-01-01 DIAGNOSIS — Z01812 Encounter for preprocedural laboratory examination: Secondary | ICD-10-CM

## 2018-01-01 DIAGNOSIS — I480 Paroxysmal atrial fibrillation: Secondary | ICD-10-CM | POA: Diagnosis not present

## 2018-01-01 LAB — BASIC METABOLIC PANEL
BUN/Creatinine Ratio: 13 (ref 10–24)
BUN: 11 mg/dL (ref 8–27)
CO2: 25 mmol/L (ref 20–29)
CREATININE: 0.85 mg/dL (ref 0.76–1.27)
Calcium: 9.8 mg/dL (ref 8.6–10.2)
Chloride: 98 mmol/L (ref 96–106)
GFR calc Af Amer: 101 mL/min/{1.73_m2} (ref >59)
GFR, EST NON AFRICAN AMERICAN: 87 mL/min/{1.73_m2} (ref >59)
Glucose: 96 mg/dL (ref 65–99)
Potassium: 4.3 mmol/L (ref 3.5–5.2)
SODIUM: 138 mmol/L (ref 134–144)

## 2018-01-01 LAB — CBC
HEMOGLOBIN: 14.2 g/dL (ref 13.0–17.7)
Hematocrit: 40.8 % (ref 37.5–51.0)
MCH: 31.5 pg (ref 26.6–33.0)
MCHC: 34.8 g/dL (ref 31.5–35.7)
MCV: 91 fL (ref 79–97)
Platelets: 232 10*3/uL (ref 150–450)
RBC: 4.51 x10E6/uL (ref 4.14–5.80)
RDW: 13.3 % (ref 12.3–15.4)
WBC: 5.9 10*3/uL (ref 3.4–10.8)

## 2018-01-02 ENCOUNTER — Ambulatory Visit (HOSPITAL_COMMUNITY): Payer: Medicare Other

## 2018-01-02 ENCOUNTER — Ambulatory Visit (HOSPITAL_COMMUNITY)
Admission: RE | Admit: 2018-01-02 | Discharge: 2018-01-02 | Disposition: A | Discharge status: Home / Self Care | Payer: Medicare Other | Source: Ambulatory Visit | Attending: Cardiology | Admitting: Cardiology

## 2018-01-02 DIAGNOSIS — I480 Paroxysmal atrial fibrillation: Secondary | ICD-10-CM | POA: Insufficient documentation

## 2018-01-02 MED ORDER — IOPAMIDOL (ISOVUE-370) INJECTION 76%
100.0000 mL | Freq: Once | INTRAVENOUS | Status: AC | PRN
  Administered 2018-01-02: 100 mL via INTRAVENOUS

## 2018-01-03 NOTE — Anesthesia Preprocedure Evaluation (Addendum)
Anesthesia Evaluation  Patient identified by MRN, date of birth, ID band Patient awake    Reviewed: Allergy & Precautions, NPO status , Patient's Chart, lab work & pertinent test results  Airway Mallampati: II  TM Distance: >3 FB Neck ROM: Full    Dental no notable dental hx. (+) Teeth Intact, Dental Advisory Given   Pulmonary neg pulmonary ROS,    Pulmonary exam normal breath sounds clear to auscultation       Cardiovascular + CAD  negative cardio ROS Normal cardiovascular exam+ dysrhythmias Atrial Fibrillation  Rhythm:Regular Rate:Normal     Neuro/Psych negative neurological ROS  negative psych ROS   GI/Hepatic GERD  ,  Endo/Other    Renal/GU      Musculoskeletal   Abdominal   Peds negative pediatric ROS (+)  Hematology   Anesthesia Other Findings   Reproductive/Obstetrics                            Lab Results  Component Value Date   WBC 5.9 01/01/2018   HGB 14.2 01/01/2018   HCT 40.8 01/01/2018   MCV 91 01/01/2018   PLT 232 01/01/2018    Anesthesia Physical Anesthesia Plan  ASA: III  Anesthesia Plan: General   Post-op Pain Management:    Induction: Intravenous  PONV Risk Score and Plan: Treatment may vary due to age or medical condition  Airway Management Planned: Oral ETT  Additional Equipment:   Intra-op Plan:   Post-operative Plan: Extubation in OR  Informed Consent: I have reviewed the patients History and Physical, chart, labs and discussed the procedure including the risks, benefits and alternatives for the proposed anesthesia with the patient or authorized representative who has indicated his/her understanding and acceptance.   Dental advisory given  Plan Discussed with: CRNA  Anesthesia Plan Comments:         Anesthesia Quick Evaluation

## 2018-01-04 ENCOUNTER — Ambulatory Visit (HOSPITAL_COMMUNITY)
Admission: RE | Admit: 2018-01-04 | Discharge: 2018-01-05 | Disposition: A | Discharge status: Home / Self Care | Payer: Medicare Other | Source: Ambulatory Visit | Attending: Cardiology | Admitting: Cardiology

## 2018-01-04 ENCOUNTER — Encounter (HOSPITAL_COMMUNITY)
Admission: RE | Disposition: A | Discharge status: Home / Self Care | Payer: Self-pay | Source: Ambulatory Visit | Attending: Cardiology

## 2018-01-04 ENCOUNTER — Encounter (HOSPITAL_COMMUNITY): Payer: Self-pay | Admitting: Anesthesiology

## 2018-01-04 ENCOUNTER — Ambulatory Visit (HOSPITAL_COMMUNITY): Payer: Medicare Other | Admitting: Anesthesiology

## 2018-01-04 ENCOUNTER — Other Ambulatory Visit: Payer: Self-pay

## 2018-01-04 DIAGNOSIS — K219 Gastro-esophageal reflux disease without esophagitis: Secondary | ICD-10-CM | POA: Diagnosis not present

## 2018-01-04 DIAGNOSIS — Z7901 Long term (current) use of anticoagulants: Secondary | ICD-10-CM | POA: Insufficient documentation

## 2018-01-04 DIAGNOSIS — I2510 Atherosclerotic heart disease of native coronary artery without angina pectoris: Secondary | ICD-10-CM | POA: Diagnosis not present

## 2018-01-04 DIAGNOSIS — I480 Paroxysmal atrial fibrillation: Secondary | ICD-10-CM | POA: Insufficient documentation

## 2018-01-04 DIAGNOSIS — Z79899 Other long term (current) drug therapy: Secondary | ICD-10-CM | POA: Insufficient documentation

## 2018-01-04 DIAGNOSIS — E785 Hyperlipidemia, unspecified: Secondary | ICD-10-CM | POA: Insufficient documentation

## 2018-01-04 DIAGNOSIS — I4891 Unspecified atrial fibrillation: Secondary | ICD-10-CM | POA: Diagnosis not present

## 2018-01-04 DIAGNOSIS — K589 Irritable bowel syndrome without diarrhea: Secondary | ICD-10-CM | POA: Diagnosis not present

## 2018-01-04 DIAGNOSIS — Z9889 Other specified postprocedural states: Secondary | ICD-10-CM | POA: Diagnosis not present

## 2018-01-04 HISTORY — PX: ATRIAL FIBRILLATION ABLATION: EP1191

## 2018-01-04 LAB — POCT ACTIVATED CLOTTING TIME
ACTIVATED CLOTTING TIME: 125 s
ACTIVATED CLOTTING TIME: 340 s
Activated Clotting Time: 323 seconds
Activated Clotting Time: 351 seconds

## 2018-01-04 SURGERY — ATRIAL FIBRILLATION ABLATION
Anesthesia: General

## 2018-01-04 MED ORDER — HEPARIN (PORCINE) IN NACL 1000-0.9 UT/500ML-% IV SOLN
INTRAVENOUS | Status: AC
  Filled 2018-01-04: qty 500

## 2018-01-04 MED ORDER — DILTIAZEM HCL ER COATED BEADS 180 MG PO CP24
180.0000 mg | ORAL_CAPSULE | Freq: Every day | ORAL | Status: DC
  Administered 2018-01-04 – 2018-01-05 (×2): 180 mg via ORAL
  Filled 2018-01-04 (×2): qty 1

## 2018-01-04 MED ORDER — CALCIUM CARBONATE ANTACID 500 MG PO CHEW: 1.0000 | CHEWABLE_TABLET | Freq: Every day | ORAL | Status: DC | PRN

## 2018-01-04 MED ORDER — LIDOCAINE 2% (20 MG/ML) 5 ML SYRINGE
INTRAMUSCULAR | Status: DC | PRN
  Administered 2018-01-04: 100 mg via INTRAVENOUS

## 2018-01-04 MED ORDER — DEXAMETHASONE SODIUM PHOSPHATE 10 MG/ML IJ SOLN
INTRAMUSCULAR | Status: DC | PRN
  Administered 2018-01-04: 10 mg via INTRAVENOUS

## 2018-01-04 MED ORDER — ROCURONIUM BROMIDE 10 MG/ML (PF) SYRINGE
PREFILLED_SYRINGE | INTRAVENOUS | Status: DC | PRN
  Administered 2018-01-04: 50 mg via INTRAVENOUS

## 2018-01-04 MED ORDER — BUPIVACAINE HCL (PF) 0.25 % IJ SOLN
INTRAMUSCULAR | Status: DC | PRN
  Administered 2018-01-04: 30 mL

## 2018-01-04 MED ORDER — PROPOFOL 10 MG/ML IV BOLUS
INTRAVENOUS | Status: DC | PRN
  Administered 2018-01-04: 150 mg via INTRAVENOUS

## 2018-01-04 MED ORDER — HEPARIN (PORCINE) IN NACL 1000-0.9 UT/500ML-% IV SOLN
INTRAVENOUS | Status: DC | PRN
  Administered 2018-01-04 (×5): 500 mL

## 2018-01-04 MED ORDER — DOBUTAMINE IN D5W 4-5 MG/ML-% IV SOLN
INTRAVENOUS | Status: DC | PRN
  Administered 2018-01-04: 20 ug/kg/min via INTRAVENOUS

## 2018-01-04 MED ORDER — SODIUM CHLORIDE 0.9 % IV SOLN
INTRAVENOUS | Status: DC | PRN
  Administered 2018-01-04: 25 ug/min via INTRAVENOUS

## 2018-01-04 MED ORDER — SODIUM CHLORIDE 0.9 % IV SOLN: 250.0000 mL | INTRAVENOUS | Status: DC | PRN

## 2018-01-04 MED ORDER — PSEUDOEPHEDRINE HCL 30 MG PO TABS
30.0000 mg | ORAL_TABLET | ORAL | Status: DC | PRN
  Filled 2018-01-04: qty 1

## 2018-01-04 MED ORDER — ACETAMINOPHEN 325 MG PO TABS: 650.0000 mg | ORAL_TABLET | ORAL | Status: DC | PRN

## 2018-01-04 MED ORDER — ONDANSETRON HCL 4 MG/2ML IJ SOLN
INTRAMUSCULAR | Status: DC | PRN
  Administered 2018-01-04: 4 mg via INTRAVENOUS

## 2018-01-04 MED ORDER — SODIUM CHLORIDE 0.9% FLUSH: 3.0000 mL | INTRAVENOUS | Status: DC | PRN

## 2018-01-04 MED ORDER — HEPARIN SODIUM (PORCINE) 1000 UNIT/ML IJ SOLN
INTRAMUSCULAR | Status: DC | PRN
  Administered 2018-01-04: 1000 [IU] via INTRAVENOUS

## 2018-01-04 MED ORDER — ONDANSETRON HCL 4 MG/2ML IJ SOLN: 4.0000 mg | Freq: Four times a day (QID) | INTRAMUSCULAR | Status: DC | PRN

## 2018-01-04 MED ORDER — TAMSULOSIN HCL 0.4 MG PO CAPS: 0.4000 mg | ORAL_CAPSULE | Freq: Every day | ORAL | Status: DC | PRN

## 2018-01-04 MED ORDER — DOBUTAMINE IN D5W 4-5 MG/ML-% IV SOLN
INTRAVENOUS | Status: AC
  Filled 2018-01-04: qty 250

## 2018-01-04 MED ORDER — COQ10 100 MG PO CAPS: 100.0000 mg | ORAL_CAPSULE | Freq: Every evening | ORAL | Status: DC

## 2018-01-04 MED ORDER — FENTANYL CITRATE (PF) 250 MCG/5ML IJ SOLN
INTRAMUSCULAR | Status: DC | PRN
  Administered 2018-01-04 (×2): 50 ug via INTRAVENOUS

## 2018-01-04 MED ORDER — PRAVASTATIN SODIUM 40 MG PO TABS
40.0000 mg | ORAL_TABLET | Freq: Every evening | ORAL | Status: DC
  Administered 2018-01-04: 40 mg via ORAL
  Filled 2018-01-04: qty 1

## 2018-01-04 MED ORDER — FLUTICASONE PROPIONATE 50 MCG/ACT NA SUSP
1.0000 | Freq: Every day | NASAL | Status: DC | PRN
  Filled 2018-01-04: qty 16

## 2018-01-04 MED ORDER — HEPARIN SODIUM (PORCINE) 1000 UNIT/ML IJ SOLN
INTRAMUSCULAR | Status: AC
  Filled 2018-01-04: qty 1

## 2018-01-04 MED ORDER — ADULT MULTIVITAMIN W/MINERALS CH
1.0000 | ORAL_TABLET | Freq: Every morning | ORAL | Status: DC
  Administered 2018-01-05: 1 via ORAL
  Filled 2018-01-04: qty 1

## 2018-01-04 MED ORDER — VALACYCLOVIR HCL 500 MG PO TABS: 2000.0000 mg | ORAL_TABLET | Freq: Two times a day (BID) | ORAL | Status: DC | PRN

## 2018-01-04 MED ORDER — PROTAMINE SULFATE 10 MG/ML IV SOLN
INTRAVENOUS | Status: DC | PRN
  Administered 2018-01-04 (×5): 10 mg via INTRAVENOUS

## 2018-01-04 MED ORDER — BUPIVACAINE HCL (PF) 0.25 % IJ SOLN
INTRAMUSCULAR | Status: AC
  Filled 2018-01-04: qty 30

## 2018-01-04 MED ORDER — SUGAMMADEX SODIUM 200 MG/2ML IV SOLN
INTRAVENOUS | Status: DC | PRN
  Administered 2018-01-04: 131.6 mg via INTRAVENOUS

## 2018-01-04 MED ORDER — SODIUM CHLORIDE 0.9 % IV SOLN
INTRAVENOUS | Status: DC
  Administered 2018-01-04: 06:00:00 via INTRAVENOUS

## 2018-01-04 MED ORDER — APIXABAN 5 MG PO TABS
5.0000 mg | ORAL_TABLET | Freq: Two times a day (BID) | ORAL | Status: DC
  Administered 2018-01-04 – 2018-01-05 (×3): 5 mg via ORAL
  Filled 2018-01-04 (×3): qty 1

## 2018-01-04 MED ORDER — SODIUM CHLORIDE 0.9% FLUSH
3.0000 mL | Freq: Two times a day (BID) | INTRAVENOUS | Status: DC
  Administered 2018-01-04 – 2018-01-05 (×3): 3 mL via INTRAVENOUS

## 2018-01-04 MED ORDER — HEPARIN SODIUM (PORCINE) 1000 UNIT/ML IJ SOLN
INTRAMUSCULAR | Status: DC | PRN
  Administered 2018-01-04: 2000 [IU] via INTRAVENOUS
  Administered 2018-01-04: 14000 [IU] via INTRAVENOUS

## 2018-01-04 SURGICAL SUPPLY — 20 items
BAG SNAP BAND KOVER 36X36 (MISCELLANEOUS) ×2 IMPLANT
BLANKET WARM UNDERBOD FULL ACC (MISCELLANEOUS) ×2 IMPLANT
CATH MAPPNG PENTARAY F 2-6-2MM (CATHETERS) IMPLANT
CATH SMTCH THERMOCOOL SF DF (CATHETERS) ×2 IMPLANT
CATH SOUNDSTAR 3D IMAGING (CATHETERS) ×2 IMPLANT
CATH WEBSTER BI DIR CS D-F CRV (CATHETERS) ×2 IMPLANT
COVER SWIFTLINK CONNECTOR (BAG) ×2 IMPLANT
PACK EP LATEX FREE (CUSTOM PROCEDURE TRAY) ×3
PACK EP LF (CUSTOM PROCEDURE TRAY) ×1 IMPLANT
PAD PRO RADIOLUCENT 2001M-C (PAD) ×3 IMPLANT
PATCH CARTO3 (PAD) ×2 IMPLANT
PENTARAY F 2-6-2MM (CATHETERS) ×3
SHEATH AVANTI 11F 11CM (SHEATH) ×2 IMPLANT
SHEATH BAYLIS SUREFLEX  M 8.5 (SHEATH) ×4
SHEATH BAYLIS SUREFLEX M 8.5 (SHEATH) IMPLANT
SHEATH BAYLIS TRANSSEPTAL 98CM (NEEDLE) ×2 IMPLANT
SHEATH PINNACLE 7F 10CM (SHEATH) ×3 IMPLANT
SHEATH PINNACLE 8F 10CM (SHEATH) ×6 IMPLANT
SHEATH PINNACLE 9F 10CM (SHEATH) ×6 IMPLANT
TUBING SMART ABLATE COOLFLOW (TUBING) ×2 IMPLANT

## 2018-01-04 NOTE — H&P (Signed)
Devin Rosales has presented today for surgery, with the diagnosis of atrial fibrillation.  The various methods of treatment have been discussed with the patient and family. After consideration of risks, benefits and other options for treatment, the patient has consented to  Procedure(s): Catheter ablation as a surgical intervention .  Risks include but not limited to bleeding, tamponade, heart block, stroke, damage to surrounding organs, among others. The patient's history has been reviewed, patient examined, no change in status, stable for surgery.  I have reviewed the patient's chart and labs.  Questions were answered to the patient's satisfaction.    Shawana Knoch Curt Bears, MD 01/04/2018 7:03 AM

## 2018-01-04 NOTE — Progress Notes (Signed)
Site area: RFV x 2--LFV x 2 Site Prior to Removal:  Level 0/0 Pressure Applied For:20 min each side Manual:   yes Patient Status During Pull:  stable Post Pull Site:  Level 0/0 Post Pull Instructions Given:  yes Post Pull Pulses Present: palpable Dressing Applied:  clear Bedrest begins @ 1130 till 1730 Comments:

## 2018-01-04 NOTE — Discharge Summary (Addendum)
ELECTROPHYSIOLOGY PROCEDURE DISCHARGE SUMMARY    Patient ID: Devin Rosales,  MRN: [id number], DOB/AGE: July 29, 1945 72 y.o.  Admit date: 01/04/2018 Discharge date: 01/05/18  Primary Care Physician: Lavone Orn, MD  Primary Cardiologist: Dr. Marlou Porch Electrophysiologist: Dr. Curt Bears  Primary Discharge Diagnosis:  1. Paroxysmal AFib     CHA2DS2Vasc is 2, on Eliquis  Secondary Discharge Diagnosis:  1. CAD (non-obstructive) 2. HLD  Procedures This Admission:  1.  Electrophysiology study and radiofrequency catheter ablation on 01/04/18 by Dr Curt Bears.   This study demonstrated   CONCLUSIONS: 1. Sinus rhythm upon presentation.   2. Successful electrical isolation and anatomical encircling of all four pulmonary veins with radiofrequency current. 3. No inducible arrhythmias following ablation both on and off of dobutamine 4. No early apparent complications.    Brief HPI: Devin Rosales is a 72 y.o. male with a history of paroxysmal atrial fibrillation.  Risks, benefits, and alternatives to catheter ablation of atrial fibrillation were reviewed with the patient who wished to proceed.  The patient underwent cardiac CT prior to the procedure which demonstrated no LAA thrombus.    Hospital Course:  The patient was admitted and underwent EPS/RFCA of atrial fibrillation with details as outlined above.  They were monitored on telemetry overnight which demonstrated SR.  b/l groins are without complication on the day of discharge.  The patient feels well, no CP or SOB, he was examined by Dr. Curt Bears and considered to be stable for discharge.  Wound care and restrictions were reviewed with the patient.  The patient Devin Rosales be seen back by Devin Palau, NP in 4 weeks and Dr  Curt Bears in 12 weeks for post ablation follow up.    Physical Exam: Vitals:   01/04/18 1410 01/04/18 1543 01/04/18 2000 01/05/18 0557  BP:  119/79 109/73 111/66  Pulse: 77  82 75  Resp: 11  16 18   Temp:   98.3 F (36.8 C) 98.1  F (36.7 C)  TempSrc:   Oral Oral  SpO2: 98%  96% 96%  Weight:      Height:        GEN- The patient is well appearing, alert and oriented x 3 today.   HEENT: normocephalic, atraumatic; sclera clear, conjunctiva pink; hearing intact; oropharynx clear; neck supple  Lungs- CTA b/l, normal work of breathing.  No wheezes, rales, rhonchi Heart- RRR, no murmurs, rubs or gallops  GI- soft, non-tender, non-distended Extremities- no clubbing, cyanosis, or edema; DP/PT 2+ bilaterally, groin without hematoma/bruit MS- no significant deformity or atrophy Skin- warm and dry, no rash or lesion Psych- euthymic mood, full affect Neuro- strength and sensation are intact   Labs:   Lab Results  Component Value Date   WBC 5.9 01/01/2018   HGB 14.2 01/01/2018   HCT 40.8 01/01/2018   MCV 91 01/01/2018   PLT 232 01/01/2018    Recent Labs  Lab 01/01/18 0915  NA 138  K 4.3  CL 98  CO2 25  BUN 11  CREATININE 0.85  CALCIUM 9.8  GLUCOSE 96     Discharge Medications:  Allergies as of 01/05/2018      Reactions   Crestor [rosuvastatin Calcium]    MYALGIAS AND LETHARGY   Lipitor [atorvastatin]    MYALGIAS   Other    perfume and cologne causes congestion    Vytorin [ezetimibe-simvastatin] Other (See Comments)   MYALGIAS   Zocor [simvastatin] Other (See Comments)   MYALGIAS      Medication List    TAKE these  medications   ALLERGY EYE DROPS OP Place 1 drop into both eyes daily as needed (allergies).   apixaban 5 MG Tabs tablet Commonly known as:  ELIQUIS Take 1 tablet (5 mg total) by mouth 2 (two) times daily.   calcium carbonate 500 MG chewable tablet Commonly known as:  TUMS - dosed in mg elemental calcium Chew 1 tablet by mouth daily as needed for indigestion or heartburn.   CoQ10 100 MG Caps Take 100 mg by mouth every evening.   diltiazem 120 MG 24 hr capsule Commonly known as:  CARDIZEM CD Take 1 capsule (120 mg total) by mouth daily. What changed:    medication  strength  how much to take   diltiazem 30 MG tablet Commonly known as:  CARDIZEM TAKE 1 TABLET BY MOUTH DAILY AS NEEDED FOR RAPID BREAKTHROUGH PALPITATIONS What changed:  See the new instructions.   fluticasone 50 MCG/ACT nasal spray Commonly known as:  FLONASE Place 1-2 sprays into both nostrils See admin instructions. Use 2 sprays in the morning in each nare and 1 spray in each nare at Rosales as needed for allergys   multivitamin with minerals Tabs tablet Take 1 tablet by mouth every morning.   pravastatin 40 MG tablet Commonly known as:  PRAVACHOL Take 40 mg by mouth every evening.   pseudoephedrine 30 MG tablet Commonly known as:  SUDAFED Take 30 mg by mouth every 4 (four) hours as needed (sinus headaches).   tamsulosin 0.4 MG Caps capsule Commonly known as:  FLOMAX Take 0.4 mg by mouth daily as needed (slow urine flow).   valACYclovir 1000 MG tablet Commonly known as:  VALTREX Take 2,000 mg by mouth 2 (two) times daily as needed (fever blisters).       Disposition:  Home  Discharge Instructions    Diet - low sodium heart healthy   Complete by:  As directed    Increase activity slowly   Complete by:  As directed      Follow-up Information    [facility] Follow up on 02/06/2018.   Specialty:  Cardiology Why:  8:30AM Contact information: [address] [id number] mc [place] [address] [phone]       Devin Haw, MD Follow up on 04/10/2018.   Specialty:  Cardiology Why:  11:30AM Contact information: [place] [address] [phone]           Duration of Discharge Encounter: Greater than 30 minutes including physician time.  Devin Night, PA-C 01/05/2018 9:42 AM   I have seen and examined this patient with Devin Rosales.  Agree with above, note added to reflect my findings.  On exam, RRR, no murmurs, lungs clear.  Patient had AF ablation for paroxysmal  atrial fibrillation. Tolerated the procedure well without complaint this morning. Plan for discharge today with follow up in EP clinic.  Devin Rosales M. Devin Perry MD 01/05/2018 9:25 PM

## 2018-01-04 NOTE — Anesthesia Postprocedure Evaluation (Signed)
Anesthesia Post Note  Patient: Devin Rosales  Procedure(s) Performed: ATRIAL FIBRILLATION ABLATION (N/A )     Patient location during evaluation: PACU Anesthesia Type: General Level of consciousness: awake and alert Pain management: pain level controlled Vital Signs Assessment: post-procedure vital signs reviewed and stable Respiratory status: spontaneous breathing, nonlabored ventilation, respiratory function stable and patient connected to nasal cannula oxygen Cardiovascular status: blood pressure returned to baseline and stable Postop Assessment: no apparent nausea or vomiting Anesthetic complications: no    Last Vitals:  Vitals:   01/04/18 1405 01/04/18 1410  BP: 128/75   Pulse: 77 77  Resp: (!) 8 11  Temp:    SpO2: 99% 98%    Last Pain:  Vitals:   01/04/18 1051  TempSrc: Temporal  PainSc:                  Barnet Glasgow

## 2018-01-04 NOTE — Discharge Instructions (Signed)
°  Post procedure care instructions No driving for 4 days. No lifting over 5 lbs for 1 week. No vigorous or sexual activity for 1 week. You may return to work on 9/19.19. Keep procedure site clean & dry. If you notice increased pain, swelling, bleeding or pus, call/return!  You may shower, but no soaking baths/hot tubs/pools for 1 week.    You have an appointment set up with the [facility].  Multiple studies have shown that being followed by a dedicated atrial fibrillation clinic in addition to the standard care you receive from your other physicians improves health. We believe that enrollment in the atrial fibrillation clinic will allow [place] to better care for you.   The phone number to the [facility] is [phone]. The clinic is staffed Monday through Friday from 8:30am to 5pm.  Parking Directions: The clinic is located in the Heart and Vascular Building connected to [facility]. 1)From [address] turn on to [address] and go to the 3rd entrance  (Heart and Vascular entrance) on the right. 2)Look to the right for Heart &Vascular Parking Garage. 3)A code for the entrance is required please call the clinic to receive this.   4)Take the elevators to the 1st floor. Registration is in the room with the glass walls at the end of the hallway.  If you have any trouble parking or locating the clinic, please dont hesitate to call [phone].

## 2018-01-04 NOTE — Anesthesia Procedure Notes (Signed)
Procedure Name: Intubation Date/Time: 01/04/2018 7:54 AM Performed by: Renato Shin, CRNA Pre-anesthesia Checklist: Patient identified, Emergency Drugs available, Suction available and Patient being monitored Patient Re-evaluated:Patient Re-evaluated prior to induction Oxygen Delivery Method: Circle system utilized Preoxygenation: Pre-oxygenation with 100% oxygen Induction Type: IV induction Ventilation: Mask ventilation without difficulty Laryngoscope Size: Miller and 2 Grade View: Grade II Tube type: Oral Tube size: 7.5 mm Number of attempts: 1 Airway Equipment and Method: Stylet Placement Confirmation: ETT inserted through vocal cords under direct vision,  positive ETCO2,  CO2 detector and breath sounds checked- equal and bilateral Secured at: 22 cm Tube secured with: Tape Dental Injury: Teeth and Oropharynx as per pre-operative assessment

## 2018-01-04 NOTE — Transfer of Care (Signed)
Immediate Anesthesia Transfer of Care Note  Patient: Devin Rosales  Procedure(s) Performed: ATRIAL FIBRILLATION ABLATION (N/A )  Patient Location: PACU and Cath Lab  Anesthesia Type:General  Level of Consciousness: awake, alert  and patient cooperative  Airway & Oxygen Therapy: Patient Spontanous Breathing and Patient connected to nasal cannula oxygen  Post-op Assessment: Report given to RN and Post -op Vital signs reviewed and stable  Post vital signs: Reviewed and stable  Last Vitals:  Vitals Value Taken Time  BP    Temp    Pulse 80 01/04/2018 10:24 AM  Resp 14 01/04/2018 10:24 AM  SpO2 98 % 01/04/2018 10:24 AM  Vitals shown include unvalidated device data.  Last Pain:  Vitals:   01/04/18 0605  TempSrc:   PainSc: 0-No pain         Complications: No apparent anesthesia complications

## 2018-01-05 ENCOUNTER — Encounter (HOSPITAL_COMMUNITY): Payer: Self-pay | Admitting: Cardiology

## 2018-01-05 DIAGNOSIS — K219 Gastro-esophageal reflux disease without esophagitis: Secondary | ICD-10-CM | POA: Diagnosis not present

## 2018-01-05 DIAGNOSIS — Z7901 Long term (current) use of anticoagulants: Secondary | ICD-10-CM | POA: Diagnosis not present

## 2018-01-05 DIAGNOSIS — I480 Paroxysmal atrial fibrillation: Secondary | ICD-10-CM | POA: Diagnosis not present

## 2018-01-05 DIAGNOSIS — K589 Irritable bowel syndrome without diarrhea: Secondary | ICD-10-CM | POA: Diagnosis not present

## 2018-01-05 DIAGNOSIS — I2510 Atherosclerotic heart disease of native coronary artery without angina pectoris: Secondary | ICD-10-CM | POA: Diagnosis not present

## 2018-01-05 DIAGNOSIS — Z79899 Other long term (current) drug therapy: Secondary | ICD-10-CM | POA: Diagnosis not present

## 2018-01-05 DIAGNOSIS — E785 Hyperlipidemia, unspecified: Secondary | ICD-10-CM | POA: Diagnosis not present

## 2018-01-05 DIAGNOSIS — Z9889 Other specified postprocedural states: Secondary | ICD-10-CM | POA: Diagnosis not present

## 2018-01-05 MED ORDER — DILTIAZEM HCL ER COATED BEADS 120 MG PO CP24: 120.0000 mg | ORAL_CAPSULE | Freq: Every day | ORAL | 1 refills | Status: DC

## 2018-01-07 ENCOUNTER — Telehealth: Payer: Self-pay | Admitting: Physician Assistant

## 2018-01-07 NOTE — Telephone Encounter (Signed)
Paged by answering service, patient has acid reflux since his ablation.  Worse at laying down.  Advised to take over-the-counter antiacid/Nexium.  No other symptoms.  He will call his PCP if no improvement.

## 2018-01-08 ENCOUNTER — Telehealth: Payer: Self-pay | Admitting: Cardiology

## 2018-01-08 NOTE — Telephone Encounter (Signed)
New Message:    Patient states he is having burning in this throat since the surgery

## 2018-01-08 NOTE — Telephone Encounter (Signed)
Advised pt to take OTC Nexium BID for the next week or two to see how he does, per Dr. Curt Bears. Advised to call the office if this does not make a difference in "burning sensation". Pt will call back if this does not help and problems persists/continues.

## 2018-01-08 NOTE — Telephone Encounter (Signed)
Pt c/o burning in his throat.  Located "right below my adam's apple".  Tums helps for a little while. He is using OTC Nexium also and would like to know if he should try this BID? Reports that at night if he doesn't have his "head up/elevated" the burning bothers him a lot. Denies coughing up any blood. Will rv w/ WC and let pt know recommendation/s. Pt agreeable to plan.

## 2018-02-05 ENCOUNTER — Ambulatory Visit (HOSPITAL_COMMUNITY): Payer: Self-pay | Admitting: Nurse Practitioner

## 2018-02-06 ENCOUNTER — Encounter (HOSPITAL_COMMUNITY): Payer: Self-pay | Admitting: Nurse Practitioner

## 2018-02-06 ENCOUNTER — Ambulatory Visit (HOSPITAL_COMMUNITY)
Admission: RE | Admit: 2018-02-06 | Discharge: 2018-02-06 | Disposition: A | Discharge status: Home / Self Care | Payer: Medicare Other | Source: Ambulatory Visit | Attending: Nurse Practitioner | Admitting: Nurse Practitioner

## 2018-02-06 VITALS — BP 136/78 | HR 78 | Ht 65.0 in | Wt 142.0 lb

## 2018-02-06 DIAGNOSIS — I2510 Atherosclerotic heart disease of native coronary artery without angina pectoris: Secondary | ICD-10-CM | POA: Diagnosis not present

## 2018-02-06 DIAGNOSIS — N400 Enlarged prostate without lower urinary tract symptoms: Secondary | ICD-10-CM | POA: Insufficient documentation

## 2018-02-06 DIAGNOSIS — Z7901 Long term (current) use of anticoagulants: Secondary | ICD-10-CM | POA: Diagnosis not present

## 2018-02-06 DIAGNOSIS — Z79899 Other long term (current) drug therapy: Secondary | ICD-10-CM | POA: Insufficient documentation

## 2018-02-06 DIAGNOSIS — E7800 Pure hypercholesterolemia, unspecified: Secondary | ICD-10-CM | POA: Diagnosis not present

## 2018-02-06 DIAGNOSIS — I480 Paroxysmal atrial fibrillation: Secondary | ICD-10-CM | POA: Diagnosis not present

## 2018-02-06 DIAGNOSIS — K589 Irritable bowel syndrome without diarrhea: Secondary | ICD-10-CM | POA: Diagnosis not present

## 2018-02-06 DIAGNOSIS — K219 Gastro-esophageal reflux disease without esophagitis: Secondary | ICD-10-CM | POA: Diagnosis not present

## 2018-02-06 NOTE — Progress Notes (Signed)
Primary Care Physician: Lavone Orn, MD Referring Physician: Vonda Antigua   Devin Rosales is a 72 y.o. male with a h/o afib s/p ablation by Dr. Curt Bears one month ago. He has not noted any afib since the ablation. No swallowing or groin issues. Continues with eliquis, uninterrupted.  Today, he denies symptoms of palpitations, chest pain, shortness of breath, orthopnea, PND, lower extremity edema, dizziness, presyncope, syncope, or neurologic sequela. The patient is tolerating medications without difficulties and is otherwise without complaint today.   Past Medical History:  Diagnosis Date  . BPH (benign prostatic hyperplasia)   . CAD (coronary artery disease)   . Esophageal spasm   . Fast heart beat   . GERD (gastroesophageal reflux disease)   . Hx of insomnia   . Hx of vertigo    BENIGN POSITIONAL  . Hypercholesterolemia   . Hyperlipidemia   . Irritable bowel syndrome   . Oral herpes simplex infection   . Prostatitis 1998  . Seasonal allergic rhinitis   . Spermatocele    RIGHT   Past Surgical History:  Procedure Laterality Date  . APPENDECTOMY    . ATRIAL FIBRILLATION ABLATION N/A 01/04/2018   Procedure: ATRIAL FIBRILLATION ABLATION;  Surgeon: Constance Haw, MD;  Location: [facility];  Service: Cardiovascular;  Laterality: N/A;  . HERNIA REPAIR    . TONSILLECTOMY    . TREATMENT FISTULA ANAL      Current Outpatient Medications  Medication Sig Dispense Refill  . apixaban (ELIQUIS) 5 MG TABS tablet Take 1 tablet (5 mg total) by mouth 2 (two) times daily. 60 tablet 0  . calcium carbonate (TUMS - DOSED IN MG ELEMENTAL CALCIUM) 500 MG chewable tablet Chew 1 tablet by mouth daily as needed for indigestion or heartburn.    . Coenzyme Q10 (COQ10) 100 MG CAPS Take 100 mg by mouth every evening.    . diltiazem (CARDIZEM CD) 120 MG 24 hr capsule Take 1 capsule (120 mg total) by mouth daily. 90 capsule 1  . fluticasone (FLONASE) 50 MCG/ACT nasal spray Place 1-2 sprays  into both nostrils See admin instructions. Use 2 sprays in the morning in each nare and 1 spray in each nare at night as needed for allergys    . Ketotifen Fumarate (ALLERGY EYE DROPS OP) Place 1 drop into both eyes daily as needed (allergies).    . Multiple Vitamin (MULTIVITAMIN WITH MINERALS) TABS tablet Take 1 tablet by mouth every morning.     . pravastatin (PRAVACHOL) 40 MG tablet Take 40 mg by mouth every evening.     . tamsulosin (FLOMAX) 0.4 MG CAPS capsule Take 0.4 mg by mouth daily as needed (slow urine flow).   0  . valACYclovir (VALTREX) 1000 MG tablet Take 2,000 mg by mouth 2 (two) times daily as needed (fever blisters).    . diltiazem (CARDIZEM) 30 MG tablet TAKE 1 TABLET BY MOUTH DAILY AS NEEDED FOR RAPID BREAKTHROUGH PALPITATIONS (Patient not taking: No sig reported) 30 tablet 10  . pseudoephedrine (SUDAFED) 30 MG tablet Take 30 mg by mouth every 4 (four) hours as needed (sinus headaches).     No current facility-administered medications for this encounter.     Allergies  Allergen Reactions  . Crestor [Rosuvastatin Calcium]     MYALGIAS AND LETHARGY  . Lipitor [Atorvastatin]     MYALGIAS  . Other     perfume and cologne causes congestion   . Vytorin [Ezetimibe-Simvastatin] Other (See Comments)    MYALGIAS  . Zocor [  Simvastatin] Other (See Comments)    MYALGIAS    Social History   Socioeconomic History  . Marital status: Married    Spouse name: Not on file  . Number of children: Not on file  . Years of education: Not on file  . Highest education level: Not on file  Occupational History  . Not on file  Social Needs  . Financial resource strain: Not on file  . Food insecurity:    Worry: Not on file    Inability: Not on file  . Transportation needs:    Medical: Not on file    Non-medical: Not on file  Tobacco Use  . Smoking status: Never Smoker  . Smokeless tobacco: Never Used  Substance and Sexual Activity  . Alcohol use: Yes    Comment: 4 beers, 2  glasses of wine weekly  . Drug use: No  . Sexual activity: Yes    Comment: MARRIED  Lifestyle  . Physical activity:    Days per week: Not on file    Minutes per session: Not on file  . Stress: Not on file  Relationships  . Social connections:    Talks on phone: Not on file    Gets together: Not on file    Attends religious service: Not on file    Active member of club or organization: Not on file    Attends meetings of clubs or organizations: Not on file    Relationship status: Not on file  . Intimate partner violence:    Fear of current or ex partner: Not on file    Emotionally abused: Not on file    Physically abused: Not on file    Forced sexual activity: Not on file  Other Topics Concern  . Not on file  Social History Narrative  . Not on file    Family History  Problem Relation Age of Onset  . Breast cancer Mother   . Prostate cancer Brother   . Hypertension Brother     ROS- All systems are reviewed and negative except as per the HPI above  Physical Exam: Vitals:   02/06/18 0835  BP: 136/78  Pulse: 78  Weight: 64.4 kg  Height: 5\' 5"  (1.651 m)   Wt Readings from Last 3 Encounters:  02/06/18 64.4 kg  01/04/18 65.8 kg  12/05/17 65 kg    Labs: Lab Results  Component Value Date   NA 138 01/01/2018   K 4.3 01/01/2018   CL 98 01/01/2018   CO2 25 01/01/2018   GLUCOSE 96 01/01/2018   BUN 11 01/01/2018   CREATININE 0.85 01/01/2018   CALCIUM 9.8 01/01/2018   No results found for: INR No results found for: CHOL, HDL, LDLCALC, TRIG   GEN- The patient is well appearing, alert and oriented x 3 today.   Head- normocephalic, atraumatic Eyes-  Sclera clear, conjunctiva pink Ears- hearing intact Oropharynx- clear Neck- supple, no JVP Lymph- no cervical lymphadenopathy Lungs- Clear to ausculation bilaterally, normal work of breathing Heart- Regular rate and rhythm, no murmurs, rubs or gallops, PMI not laterally displaced GI- soft, NT, ND, + BS Extremities-  no clubbing, cyanosis, or edema MS- no significant deformity or atrophy Skin- no rash or lesion Psych- euthymic mood, full affect Neuro- strength and sensation are intact  EKG-NSR at 78 bpm, Pr int 162 ms, qrs int 78 ms, qtc 401 ms Epic records reviewed    Assessment and Plan: 1. Paroxysmal afib S/p ablation Maintaining SR No afib noted Continue  cardizem 120 mg daily Has resumed normal activites Continue eliquis 5 mg bid, reminded not to interrupt for the 3 month healing period  F/u with Dr. Curt Bears 12/17  Geroge Baseman. Kamiryn Bezanson, [facility] [address] [place], [place] [address] [phone]

## 2018-02-14 DIAGNOSIS — Z23 Encounter for immunization: Secondary | ICD-10-CM | POA: Diagnosis not present

## 2018-03-19 DIAGNOSIS — H52203 Unspecified astigmatism, bilateral: Secondary | ICD-10-CM | POA: Diagnosis not present

## 2018-03-19 DIAGNOSIS — H2513 Age-related nuclear cataract, bilateral: Secondary | ICD-10-CM | POA: Diagnosis not present

## 2018-04-10 ENCOUNTER — Ambulatory Visit (INDEPENDENT_AMBULATORY_CARE_PROVIDER_SITE_OTHER): Payer: Medicare Other | Admitting: Cardiology

## 2018-04-10 ENCOUNTER — Encounter: Payer: Self-pay | Admitting: Cardiology

## 2018-04-10 VITALS — BP 122/70 | HR 79 | Ht 65.0 in | Wt 144.0 lb

## 2018-04-10 DIAGNOSIS — I2510 Atherosclerotic heart disease of native coronary artery without angina pectoris: Secondary | ICD-10-CM | POA: Diagnosis not present

## 2018-04-10 DIAGNOSIS — I480 Paroxysmal atrial fibrillation: Secondary | ICD-10-CM

## 2018-04-10 DIAGNOSIS — E785 Hyperlipidemia, unspecified: Secondary | ICD-10-CM

## 2018-04-10 NOTE — Patient Instructions (Addendum)
Medication Instructions:  Your physician has recommended you make the following change in your medication:  1. STOP Diltiazem - long acting  * If you need a refill on your cardiac medications before your next appointment, please call your pharmacy.   Labwork: None ordered  Testing/Procedures: None ordered  Follow-Up: Your physician recommends that you schedule a follow-up appointment in: 3 months with Dr. Curt Bears.   Thank you for choosing [facility]!!   Trinidad Curet, RN [phone]

## 2018-04-10 NOTE — Progress Notes (Signed)
Electrophysiology Office Note   Date:  04/10/2018   ID:  Devin Rosales, DOB [birth year], MRN [id number]  PCP:  Lavone Orn, MD  Cardiologist:  Marlou Porch Primary Electrophysiologist:  Darcey Cardy Meredith Leeds, MD    No chief complaint on file.    History of Present Illness: Devin Rosales is a 72 y.o. male who is being seen today for the evaluation of atrial fibrillation at the request of Lavone Orn, MD. Presenting today for electrophysiology evaluation. He was diagnosed with atrial fibrillation in the spring of 2018. He noted that when he was skiing, his heart was beating very fast. He had waking up at night feeling vibrations in his chest as well as strange sensations in his throat. Since then when exercising, such as pushing a lawnmower, his heart would take off quickly for 5-10 minutes. He felt a strain sensation in the center of his chest. His apical wall showed heart rates at 192 bpm at times. Usually when he is likely he achieved heart rates in the 130s to 140s.  He presented to the emergency room on 12/01/16 with palpitations, found to have a heart rate in the 160s. He converted to sinus rhythm while in the emergency room.  He subsequently had ablation for his atrial fibrillation 01/04/2018.  Today, denies symptoms of palpitations, chest pain, shortness of breath, orthopnea, PND, lower extremity edema, claudication, dizziness, presyncope, syncope, bleeding, or neurologic sequela. The patient is tolerating medications without difficulties.  Overall he is feeling well.  He is noted no further episodes of atrial fibrillation.  He is able to do all of his daily activities.  He has been snow skiing without complication.   Past Medical History:  Diagnosis Date  . BPH (benign prostatic hyperplasia)   . CAD (coronary artery disease)   . Esophageal spasm   . Fast heart beat   . GERD (gastroesophageal reflux disease)   . Hx of insomnia   . Hx of vertigo    BENIGN POSITIONAL  .  Hypercholesterolemia   . Hyperlipidemia   . Irritable bowel syndrome   . Oral herpes simplex infection   . Prostatitis 1998  . Seasonal allergic rhinitis   . Spermatocele    RIGHT   Past Surgical History:  Procedure Laterality Date  . APPENDECTOMY    . ATRIAL FIBRILLATION ABLATION N/A 01/04/2018   Procedure: ATRIAL FIBRILLATION ABLATION;  Surgeon: Constance Haw, MD;  Location: [facility];  Service: Cardiovascular;  Laterality: N/A;  . HERNIA REPAIR    . TONSILLECTOMY    . TREATMENT FISTULA ANAL       Current Outpatient Medications  Medication Sig Dispense Refill  . apixaban (ELIQUIS) 5 MG TABS tablet Take 1 tablet (5 mg total) by mouth 2 (two) times daily. 60 tablet 0  . calcium carbonate (TUMS - DOSED IN MG ELEMENTAL CALCIUM) 500 MG chewable tablet Chew 1 tablet by mouth daily as needed for indigestion or heartburn.    . Coenzyme Q10 (COQ10) 100 MG CAPS Take 100 mg by mouth every evening.    . diltiazem (CARDIZEM CD) 120 MG 24 hr capsule Take 1 capsule (120 mg total) by mouth daily. 90 capsule 1  . diltiazem (CARDIZEM) 30 MG tablet TAKE 1 TABLET BY MOUTH DAILY AS NEEDED FOR RAPID BREAKTHROUGH PALPITATIONS (Patient not taking: No sig reported) 30 tablet 10  . fluticasone (FLONASE) 50 MCG/ACT nasal spray Place 1-2 sprays into both nostrils See admin instructions. Use 2 sprays in the morning in each nare and  1 spray in each nare at night as needed for allergys    . Ketotifen Fumarate (ALLERGY EYE DROPS OP) Place 1 drop into both eyes daily as needed (allergies).    . Multiple Vitamin (MULTIVITAMIN WITH MINERALS) TABS tablet Take 1 tablet by mouth every morning.     . pravastatin (PRAVACHOL) 40 MG tablet Take 40 mg by mouth every evening.     . pseudoephedrine (SUDAFED) 30 MG tablet Take 30 mg by mouth every 4 (four) hours as needed (sinus headaches).    . tamsulosin (FLOMAX) 0.4 MG CAPS capsule Take 0.4 mg by mouth daily as needed (slow urine flow).   0  . valACYclovir  (VALTREX) 1000 MG tablet Take 2,000 mg by mouth 2 (two) times daily as needed (fever blisters).     No current facility-administered medications for this visit.     Allergies:   Crestor [rosuvastatin calcium]; Lipitor [atorvastatin]; Other; Vytorin [ezetimibe-simvastatin]; and Zocor [simvastatin]   Social History:  The patient  reports that he has never smoked. He has never used smokeless tobacco. He reports current alcohol use. He reports that he does not use drugs.   Family History:  The patient's family history includes Breast cancer in his mother; Hypertension in his brother; Prostate cancer in his brother.   ROS:  Please see the history of present illness.   Otherwise, review of systems is positive for none.   All other systems are reviewed and negative.   PHYSICAL EXAM: VS:  There were no vitals taken for this visit. , BMI There is no height or weight on file to calculate BMI. GEN: Well nourished, well developed, in no acute distress  HEENT: normal  Neck: no JVD, carotid bruits, or masses Cardiac: RRR; no murmurs, rubs, or gallops,no edema  Respiratory:  clear to auscultation bilaterally, normal work of breathing GI: soft, nontender, nondistended, + BS MS: no deformity or atrophy  Skin: warm and dry Neuro:  Strength and sensation are intact Psych: euthymic mood, full affect  EKG:  EKG is ordered today. Personal review of the ekg ordered shows sinus rhythm, rate 79  Recent Labs: 01/01/2018: BUN 11; Creatinine, Ser 0.85; Hemoglobin 14.2; Platelets 232; Potassium 4.3; Sodium 138    Lipid Panel  No results found for: CHOL, TRIG, HDL, CHOLHDL, VLDL, LDLCALC, LDLDIRECT   Wt Readings from Last 3 Encounters:  02/06/18 142 lb (64.4 kg)  01/04/18 145 lb (65.8 kg)  12/05/17 143 lb 6.4 oz (65 kg)      Other studies Reviewed: Additional studies/ records that were reviewed today include: TTE 09/14/16  Review of the above records today demonstrates:  - Left ventricle: The cavity  size was normal. Systolic function was   vigorous. The estimated ejection fraction was in the range of 65%   to 70%. Wall motion was normal; there were no regional wall   motion abnormalities. Doppler parameters are consistent with   abnormal left ventricular relaxation (grade 1 diastolic   dysfunction). - Aortic valve: There was mild regurgitation. - Aorta: Aortic root dimension: 39 mm (ED). - Ascending aorta: The ascending aorta was mildly dilated. - Mitral valve: There was mild regurgitation.  ETT 08/25/16 - personally reviewed  Blood pressure demonstrated a normal response to exercise.  There was no ST segment deviation noted during stress.  Clinically and electrically negative for ischemia Excellent exercise tolerance.  Monitor 10/09/16 - personally reviewed  Parox AFIB detected (3%), occasional tachycardia.  Post conversion pauses noted (none >3 seconds)   ASSESSMENT  AND PLAN:  1.  Paroxysmal atrial fibrillation: Currently on Eliquis and diltiazem.  He is status post atrial fibrillation ablation 01/04/2018.  He has had no further episodes of atrial fibrillation.  He is able to do all of his daily activities without issue.  We Quinnten Calvin plan to stop her diltiazem today.  This patients CHA2DS2-VASc Score and unadjusted Ischemic Stroke Rate (% per year) is equal to 2.2 % stroke rate/year from a score of 2  Above score calculated as 1 point each if present [CHF, HTN, DM, Vascular=MI/PAD/Aortic Plaque, Age if 65-74, or Male] Above score calculated as 2 points each if present [Age > 75, or Stroke/TIA/TE]   2. Hyperlipidemia: Continue pravastatin  3. Coronary artery disease: 30% RCA lesion.  No chest pain.  No changes.  Current medicines are reviewed at length with the patient today.   The patient does not have concerns regarding his medicines.  The following changes were made today: Stop diltiazem  Labs/ tests ordered today include:  No orders of the defined types were placed in  this encounter.    Disposition:   FU with Ramello Cordial 3 months  Signed, Athalia Setterlund Meredith Leeds, MD  04/10/2018 9:26 AM     [facility] [address] [place] [address] [phone] (office) [phone] (fax)

## 2018-04-24 DIAGNOSIS — H6983 Other specified disorders of Eustachian tube, bilateral: Secondary | ICD-10-CM | POA: Diagnosis not present

## 2018-04-24 DIAGNOSIS — J309 Allergic rhinitis, unspecified: Secondary | ICD-10-CM | POA: Diagnosis not present

## 2018-04-24 DIAGNOSIS — H903 Sensorineural hearing loss, bilateral: Secondary | ICD-10-CM | POA: Diagnosis not present

## 2018-05-03 ENCOUNTER — Other Ambulatory Visit: Payer: Self-pay | Admitting: Cardiology

## 2018-05-04 NOTE — Telephone Encounter (Signed)
Eliquis 5mg  received for refill request. Pt is a 33yrm CrCl 0.85 (01/01/18) and wt is 65.3kg, last saw Dr. Curt Bears on 04/10/18; refilled eliquis 5mg 

## 2018-05-20 ENCOUNTER — Other Ambulatory Visit: Payer: Self-pay | Admitting: Cardiology

## 2018-05-21 NOTE — Telephone Encounter (Signed)
Pt last saw Dr Curt Bears 04/10/18, last labs 01/01/18 Creat 0.85, age 73, weight 65.3kg, based on specified criteria pt is on appropriate dosage of Eliquis 5mg  BID.  Will refill rx.

## 2018-05-23 IMAGING — CR DG CHEST 2V
2 series · 2 of 2 positions shown · non-contrast
Comparison: 07/07/2013

CLINICAL DATA: Mid chest pressure with mild exertion.  Tachycardia.

EXAM:
CHEST  2 VIEW

[chest pa]
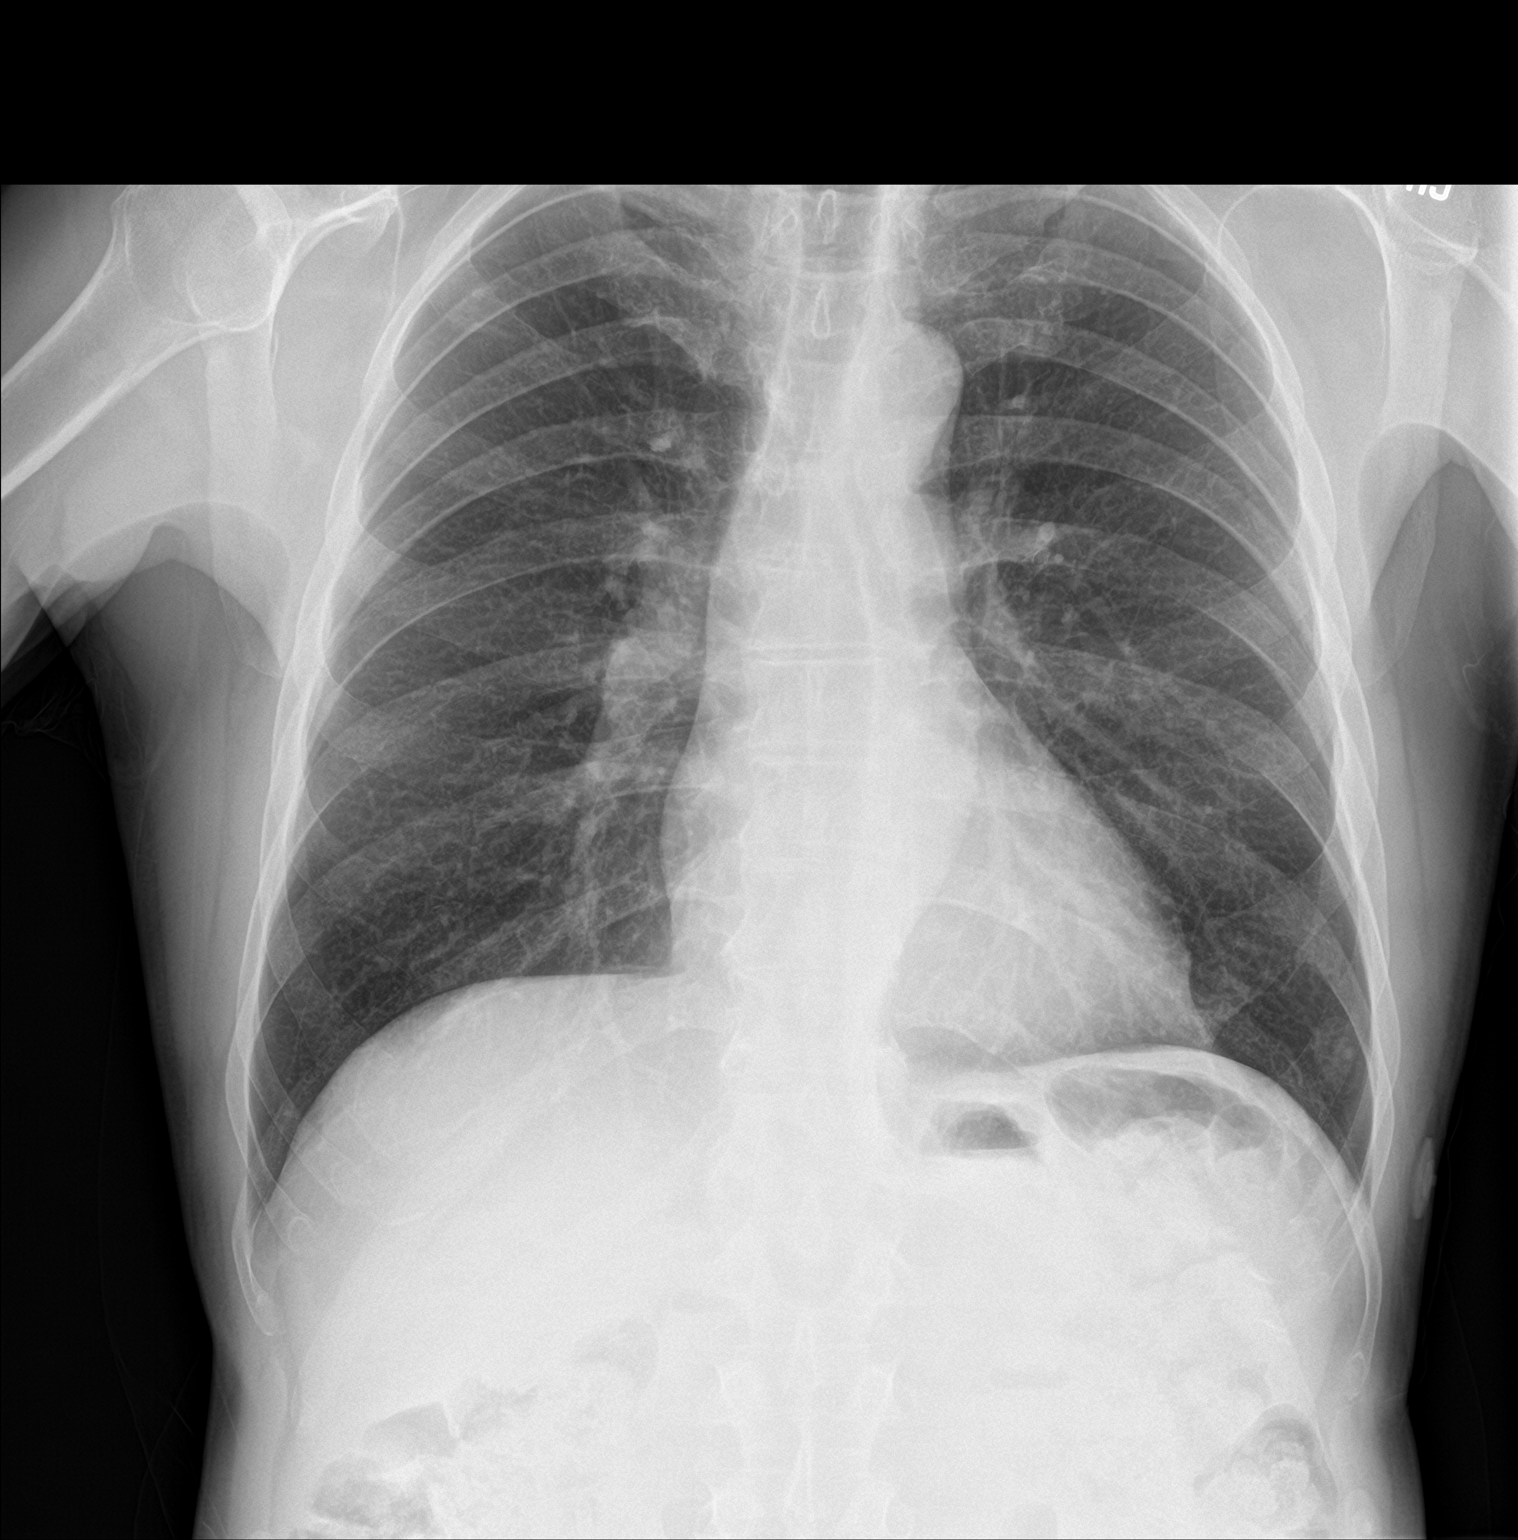

[chest lat]
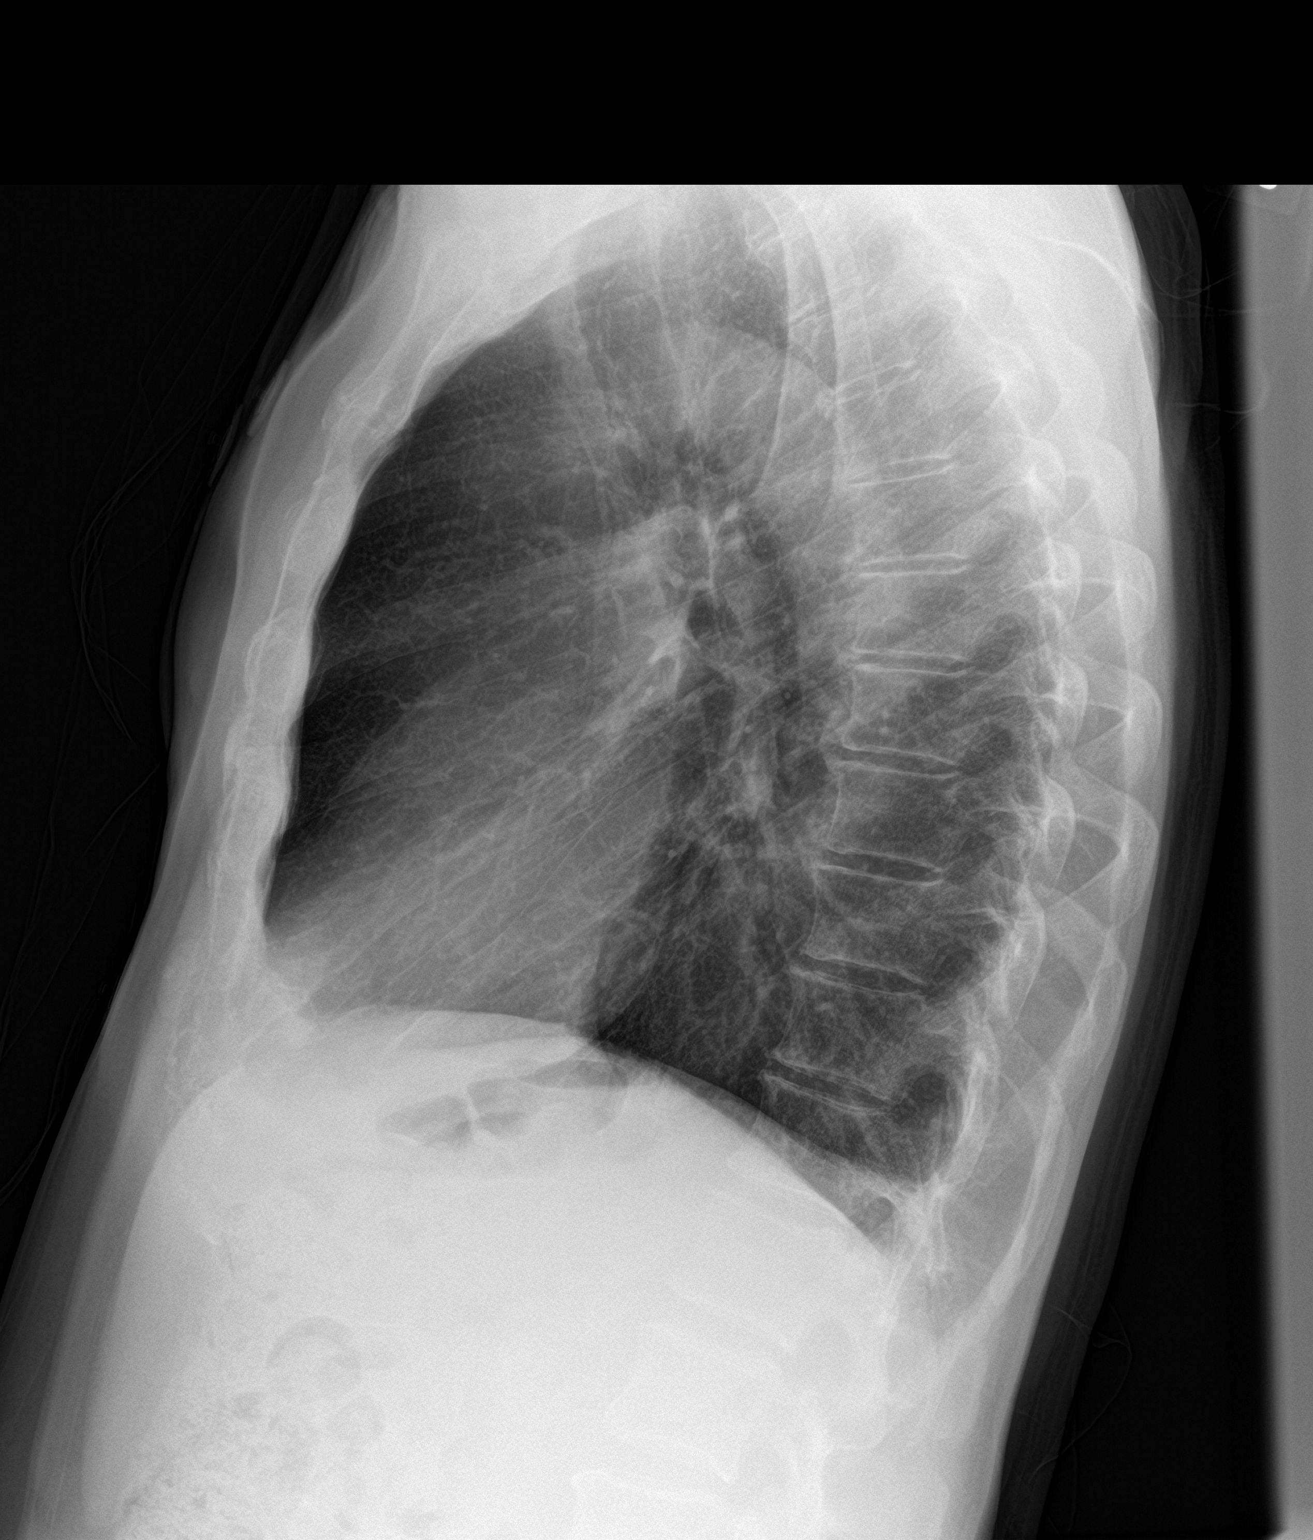

[2 of 2 positions shown; findings below may reference images not displayed]

FINDINGS: The heart size and mediastinal contours are within normal limits.
Both lungs are clear. No pulmonary edema, effusion or pneumothorax.
The visualized skeletal structures are unremarkable.
IMPRESSION: No active cardiopulmonary disease.

## 2018-06-12 DIAGNOSIS — J0100 Acute maxillary sinusitis, unspecified: Secondary | ICD-10-CM | POA: Diagnosis not present

## 2018-07-13 ENCOUNTER — Ambulatory Visit: Payer: Medicare Other | Admitting: Cardiology

## 2018-09-20 DIAGNOSIS — Z1389 Encounter for screening for other disorder: Secondary | ICD-10-CM | POA: Diagnosis not present

## 2018-09-20 DIAGNOSIS — Z0000 Encounter for general adult medical examination without abnormal findings: Secondary | ICD-10-CM | POA: Diagnosis not present

## 2018-09-20 DIAGNOSIS — Z125 Encounter for screening for malignant neoplasm of prostate: Secondary | ICD-10-CM | POA: Diagnosis not present

## 2018-09-20 DIAGNOSIS — N400 Enlarged prostate without lower urinary tract symptoms: Secondary | ICD-10-CM | POA: Diagnosis not present

## 2018-09-20 DIAGNOSIS — I480 Paroxysmal atrial fibrillation: Secondary | ICD-10-CM | POA: Diagnosis not present

## 2018-09-20 DIAGNOSIS — I2510 Atherosclerotic heart disease of native coronary artery without angina pectoris: Secondary | ICD-10-CM | POA: Diagnosis not present

## 2018-09-25 DIAGNOSIS — I2510 Atherosclerotic heart disease of native coronary artery without angina pectoris: Secondary | ICD-10-CM | POA: Diagnosis not present

## 2018-09-25 DIAGNOSIS — Z125 Encounter for screening for malignant neoplasm of prostate: Secondary | ICD-10-CM | POA: Diagnosis not present

## 2018-10-17 DIAGNOSIS — H2513 Age-related nuclear cataract, bilateral: Secondary | ICD-10-CM | POA: Diagnosis not present

## 2018-10-17 DIAGNOSIS — H40013 Open angle with borderline findings, low risk, bilateral: Secondary | ICD-10-CM | POA: Diagnosis not present

## 2018-10-17 DIAGNOSIS — H3562 Retinal hemorrhage, left eye: Secondary | ICD-10-CM | POA: Diagnosis not present

## 2018-10-18 ENCOUNTER — Telehealth: Payer: Self-pay | Admitting: Cardiology

## 2018-10-18 NOTE — Telephone Encounter (Signed)
New message   Patient would like to know if he can be taken off eliquis permanently. Please call to discuss.

## 2018-10-18 NOTE — Telephone Encounter (Signed)
Advised pt to continue taking until f/u w/ Camnitz to discuss further - per Camnitz. Pt scheduled for 7/17 OV f/u.   Patient verbalized understanding and agreeable to plan.

## 2018-10-18 NOTE — Telephone Encounter (Signed)
Routed to [place]

## 2018-10-18 NOTE — Telephone Encounter (Signed)
Forwarded to [employer] for review/advisement

## 2018-11-07 ENCOUNTER — Telehealth: Payer: Self-pay | Admitting: Cardiology

## 2018-11-07 NOTE — Telephone Encounter (Signed)

## 2018-11-08 DIAGNOSIS — H25812 Combined forms of age-related cataract, left eye: Secondary | ICD-10-CM | POA: Diagnosis not present

## 2018-11-08 DIAGNOSIS — H2512 Age-related nuclear cataract, left eye: Secondary | ICD-10-CM | POA: Diagnosis not present

## 2018-11-09 ENCOUNTER — Ambulatory Visit (INDEPENDENT_AMBULATORY_CARE_PROVIDER_SITE_OTHER): Payer: Medicare Other | Admitting: Cardiology

## 2018-11-09 ENCOUNTER — Encounter: Payer: Self-pay | Admitting: Cardiology

## 2018-11-09 ENCOUNTER — Other Ambulatory Visit: Payer: Self-pay

## 2018-11-09 VITALS — BP 120/80 | HR 66 | Ht 65.0 in | Wt 146.0 lb

## 2018-11-09 DIAGNOSIS — I4819 Other persistent atrial fibrillation: Secondary | ICD-10-CM

## 2018-11-09 NOTE — Patient Instructions (Signed)
Medication Instructions:    Your physician recommends that you continue on your current medications as directed. Please refer to the Current Medication list given to you today.  - If you need a refill on your cardiac medications before your next appointment, please call your pharmacy.   Labwork:  None ordered  Testing/Procedures:  None ordered  Follow-Up:  Your physician recommends that you schedule a follow-up appointment in: 3 months with Dr. Camnitz.  Thank you for choosing [facility]!!   Sira Adsit, RN [phone]         

## 2018-11-09 NOTE — Progress Notes (Signed)
PCP:  Lavone Orn, MD Primary Cardiologist: No primary care provider on file. Electrophysiologist: Will Meredith Leeds, MD   Devin Rosales is a 73 y.o. male who presents today for persistent atrial fibrillation s/p afib ablation 12/2017. Since last being seen in our clinic, the patient reports doing very well.  He denies symptoms of palpitations, chest pain, shortness of breath, orthopnea, PND, lower extremity edema, claudication, dizziness, presyncope, syncope, bleeding, or neurologic sequela. The patient is tolerating medications without difficulties.  He would like to talk about coming off of his eliquis as he skis frequently and is worried about falling/bleeding.  The patient feels that he is tolerating medications without difficulties and is otherwise without complaint today.   Past Medical History:  Diagnosis Date  . BPH (benign prostatic hyperplasia)   . CAD (coronary artery disease)   . Esophageal spasm   . Fast heart beat   . GERD (gastroesophageal reflux disease)   . Hx of insomnia   . Hx of vertigo    BENIGN POSITIONAL  . Hypercholesterolemia   . Hyperlipidemia   . Irritable bowel syndrome   . Oral herpes simplex infection   . Prostatitis 1998  . Seasonal allergic rhinitis   . Spermatocele    RIGHT   Past Surgical History:  Procedure Laterality Date  . APPENDECTOMY    . ATRIAL FIBRILLATION ABLATION N/A 01/04/2018   Procedure: ATRIAL FIBRILLATION ABLATION;  Surgeon: Constance Haw, MD;  Location: [facility];  Service: Cardiovascular;  Laterality: N/A;  . HERNIA REPAIR    . TONSILLECTOMY    . TREATMENT FISTULA ANAL      Current Outpatient Medications  Medication Sig Dispense Refill  . calcium carbonate (TUMS - DOSED IN MG ELEMENTAL CALCIUM) 500 MG chewable tablet Chew 1 tablet by mouth daily as needed for indigestion or heartburn.    . Coenzyme Q10 (COQ10) 100 MG CAPS Take 100 mg by mouth every evening.    Marland Kitchen ELIQUIS 5 MG TABS tablet TAKE 1 TABLET(5  MG) BY MOUTH TWICE DAILY 60 tablet 6  . fluticasone (FLONASE) 50 MCG/ACT nasal spray Place 1-2 sprays into both nostrils See admin instructions. Use 2 sprays in the morning in each nare and 1 spray in each nare at night as needed for allergys    . Ketotifen Fumarate (ALLERGY EYE DROPS OP) Place 1 drop into both eyes daily as needed (allergies).    . Multiple Vitamin (MULTIVITAMIN WITH MINERALS) TABS tablet Take 1 tablet by mouth every morning.     . pravastatin (PRAVACHOL) 40 MG tablet Take 40 mg by mouth every evening.     . pseudoephedrine (SUDAFED) 30 MG tablet Take 30 mg by mouth every 4 (four) hours as needed (sinus headaches).    . tamsulosin (FLOMAX) 0.4 MG CAPS capsule Take 0.4 mg by mouth daily as needed (slow urine flow).   0  . valACYclovir (VALTREX) 1000 MG tablet Take 2,000 mg by mouth 2 (two) times daily as needed (fever blisters).    . diltiazem (CARDIZEM) 30 MG tablet TAKE 1 TABLET BY MOUTH DAILY AS NEEDED FOR RAPID BREAKTHROUGH PALPITATIONS (Patient not taking: Reported on 11/09/2018) 30 tablet 10   No current facility-administered medications for this visit.     Allergies  Allergen Reactions  . Crestor [Rosuvastatin Calcium]     MYALGIAS AND LETHARGY  . Lipitor [Atorvastatin]     MYALGIAS  . Other     perfume and cologne causes congestion   . Vytorin [Ezetimibe-Simvastatin] Other (  See Comments)    MYALGIAS  . Zocor [Simvastatin] Other (See Comments)    MYALGIAS    Social History   Socioeconomic History  . Marital status: Married    Spouse name: Not on file  . Number of children: Not on file  . Years of education: Not on file  . Highest education level: Not on file  Occupational History  . Not on file  Social Needs  . Financial resource strain: Not on file  . Food insecurity    Worry: Not on file    Inability: Not on file  . Transportation needs    Medical: Not on file    Non-medical: Not on file  Tobacco Use  . Smoking status: Never Smoker  . Smokeless  tobacco: Never Used  Substance and Sexual Activity  . Alcohol use: Yes    Comment: 4 beers, 2 glasses of wine weekly  . Drug use: No  . Sexual activity: Yes    Comment: MARRIED  Lifestyle  . Physical activity    Days per week: Not on file    Minutes per session: Not on file  . Stress: Not on file  Relationships  . Social Herbalist on phone: Not on file    Gets together: Not on file    Attends religious service: Not on file    Active member of club or organization: Not on file    Attends meetings of clubs or organizations: Not on file    Relationship status: Not on file  . Intimate partner violence    Fear of current or ex partner: Not on file    Emotionally abused: Not on file    Physically abused: Not on file    Forced sexual activity: Not on file  Other Topics Concern  . Not on file  Social History Narrative  . Not on file     Review of Systems: General: No chills, fever, night sweats or weight changes  Cardiovascular:  No chest pain, dyspnea on exertion, edema, orthopnea, palpitations, paroxysmal nocturnal dyspnea Dermatological: No rash, lesions or masses Respiratory: No cough, dyspnea Urologic: No hematuria, dysuria Abdominal: No nausea, vomiting, diarrhea, bright red blood per rectum, melena, or hematemesis Neurologic: No visual changes, weakness, changes in mental status All other systems reviewed and are otherwise negative except as noted above.  Physical Exam: Vitals:   11/09/18 1604  BP: 120/80  Pulse: 66  Weight: 146 lb (66.2 kg)  Height: 5\' 5"  (1.651 m)    GEN- The patient is well appearing, alert and oriented x 3 today.   HEENT: normocephalic, atraumatic; sclera clear, conjunctiva pink; hearing intact; oropharynx clear; neck supple, no JVP Lymph- no cervical lymphadenopathy Lungs- Clear to ausculation bilaterally, normal work of breathing.  No wheezes, rales, rhonchi Heart- Regular rate and rhythm, no murmurs, rubs or gallops, PMI not  laterally displaced GI- soft, non-tender, non-distended, bowel sounds present, no hepatosplenomegaly Extremities- no clubbing, cyanosis, or edema; DP/PT/radial pulses 2+ bilaterally MS- no significant deformity or atrophy Skin- warm and dry, no rash or lesion Psych- euthymic mood, full affect Neuro- strength and sensation are intact  Assessment and Plan:  1. Persistent atrial fibrillation s/p Afib ablation 01/04/2018 - He has had no further atrial fibrillation. He would like to come off of his Eliquis. His patients CHA2DS2-VASc Score is at least 2.  Discussed with Dr. Curt Bears. Will see him back in 3 months to discuss coming off (Will be a year post ablation).  Legrand Como  Joesph July, PA-C  11/09/18 4:39 PM  I have seen and examined this patient with Oda Kilts.  Agree with above, note added to reflect my findings.  On exam, RRR, no murmurs, lungs clear.  Patient has fortunately remained in sinus rhythm since ablation.  At this point he would like to stop his Eliquis.  I have told him that due to his elevated stroke risk, I would prefer him to go at least a year on Eliquis.  We will discuss this at his next appointment.  Will M. Camnitz MD 11/09/2018 4:55 PM

## 2018-11-23 DIAGNOSIS — H0012 Chalazion right lower eyelid: Secondary | ICD-10-CM | POA: Diagnosis not present

## 2018-11-24 ENCOUNTER — Other Ambulatory Visit: Payer: Self-pay | Admitting: Cardiology

## 2018-11-26 NOTE — Telephone Encounter (Signed)
73 years old 38kg Last OV 11/09/2018 Scr 0.85 on 01/01/2018 Will send in rx for eliquis 5mg  BID

## 2018-12-06 DIAGNOSIS — H2511 Age-related nuclear cataract, right eye: Secondary | ICD-10-CM | POA: Diagnosis not present

## 2018-12-06 DIAGNOSIS — H25811 Combined forms of age-related cataract, right eye: Secondary | ICD-10-CM | POA: Diagnosis not present

## 2019-01-02 DIAGNOSIS — Z23 Encounter for immunization: Secondary | ICD-10-CM | POA: Diagnosis not present

## 2019-02-11 ENCOUNTER — Encounter: Payer: Self-pay | Admitting: Cardiology

## 2019-02-11 ENCOUNTER — Other Ambulatory Visit: Payer: Self-pay

## 2019-02-11 ENCOUNTER — Ambulatory Visit (INDEPENDENT_AMBULATORY_CARE_PROVIDER_SITE_OTHER): Payer: Medicare Other | Admitting: Cardiology

## 2019-02-11 VITALS — BP 104/68 | HR 73 | Ht 65.0 in | Wt 143.8 lb

## 2019-02-11 DIAGNOSIS — I480 Paroxysmal atrial fibrillation: Secondary | ICD-10-CM | POA: Diagnosis not present

## 2019-02-11 NOTE — Progress Notes (Signed)
Electrophysiology Office Note   Date:  02/11/2019   ID:  Devin Rosales, DOB 10/19/1945, MRN [id number]  PCP:  Lavone Orn, MD  Cardiologist:  Marlou Porch Primary Electrophysiologist:  Anari Evitt Meredith Leeds, MD    No chief complaint on file.    History of Present Illness: Devin Rosales is a 73 y.o. male who is being seen today for the evaluation of atrial fibrillation at the request of Lavone Orn, MD. Presenting today for electrophysiology evaluation. He was diagnosed with atrial fibrillation in the spring of 2018. He noted that when he was skiing, his heart was beating very fast. He had waking up at night feeling vibrations in his chest as well as strange sensations in his throat. Since then when exercising, such as pushing a lawnmower, his heart would take off quickly for 5-10 minutes. He felt a strain sensation in the center of his chest. His apical wall showed heart rates at 192 bpm at times. Usually when he is likely he achieved heart rates in the 130s to 140s.  He presented to the emergency room on 12/01/16 with palpitations, found to have a heart rate in the 160s. He converted to sinus rhythm while in the emergency room.  He subsequently had ablation for his atrial fibrillation 01/04/2018.  Today, denies symptoms of palpitations, chest pain, shortness of breath, orthopnea, PND, lower extremity edema, claudication, dizziness, presyncope, syncope, bleeding, or neurologic sequela. The patient is tolerating medications without difficulties.  He is felt well over the last few months.  He has had no further episodes of atrial fibrillation since his ablation.  He is able to do all of his daily activities without restriction.   Past Medical History:  Diagnosis Date  . BPH (benign prostatic hyperplasia)   . CAD (coronary artery disease)   . Esophageal spasm   . Fast heart beat   . GERD (gastroesophageal reflux disease)   . Hx of insomnia   . Hx of vertigo    BENIGN POSITIONAL  .  Hypercholesterolemia   . Hyperlipidemia   . Irritable bowel syndrome   . Oral herpes simplex infection   . Prostatitis 1998  . Seasonal allergic rhinitis   . Spermatocele    RIGHT   Past Surgical History:  Procedure Laterality Date  . APPENDECTOMY    . ATRIAL FIBRILLATION ABLATION N/A 01/04/2018   Procedure: ATRIAL FIBRILLATION ABLATION;  Surgeon: Constance Haw, MD;  Location: [facility];  Service: Cardiovascular;  Laterality: N/A;  . HERNIA REPAIR    . TONSILLECTOMY    . TREATMENT FISTULA ANAL       Current Outpatient Medications  Medication Sig Dispense Refill  . calcium carbonate (TUMS - DOSED IN MG ELEMENTAL CALCIUM) 500 MG chewable tablet Chew 1 tablet by mouth daily as needed for indigestion or heartburn.    . Coenzyme Q10 (COQ10) 100 MG CAPS Take 100 mg by mouth every evening.    . diltiazem (CARDIZEM) 30 MG tablet TAKE 1 TABLET BY MOUTH DAILY AS NEEDED FOR RAPID BREAKTHROUGH PALPITATIONS 30 tablet 10  . fluticasone (FLONASE) 50 MCG/ACT nasal spray Place 1-2 sprays into both nostrils See admin instructions. Use 2 sprays in the morning in each nare and 1 spray in each nare at night as needed for allergys    . Ketotifen Fumarate (ALLERGY EYE DROPS OP) Place 1 drop into both eyes daily as needed (allergies).    . Multiple Vitamin (MULTIVITAMIN WITH MINERALS) TABS tablet Take 1 tablet by mouth every morning.     Marland Kitchen  pravastatin (PRAVACHOL) 40 MG tablet Take 40 mg by mouth every evening.     . pseudoephedrine (SUDAFED) 30 MG tablet Take 30 mg by mouth every 4 (four) hours as needed (sinus headaches).    . tamsulosin (FLOMAX) 0.4 MG CAPS capsule Take 0.4 mg by mouth daily as needed (slow urine flow).   0  . valACYclovir (VALTREX) 1000 MG tablet Take 2,000 mg by mouth 2 (two) times daily as needed (fever blisters).     No current facility-administered medications for this visit.     Allergies:   Crestor [rosuvastatin calcium], Lipitor [atorvastatin], Other, Vytorin  [ezetimibe-simvastatin], and Zocor [simvastatin]   Social History:  The patient  reports that he has never smoked. He has never used smokeless tobacco. He reports current alcohol use. He reports that he does not use drugs.   Family History:  The patient's family history includes Breast cancer in his mother; Hypertension in his brother; Prostate cancer in his brother.   ROS:  Please see the history of present illness.   Otherwise, review of systems is positive for none.   All other systems are reviewed and negative.   PHYSICAL EXAM: VS:  BP 104/68   Pulse 73   Ht 5\' 5"  (1.651 m)   Wt 143 lb 12.8 oz (65.2 kg)   SpO2 98%   BMI 23.93 kg/m  , BMI Body mass index is 23.93 kg/m. GEN: Well nourished, well developed, in no acute distress  HEENT: normal  Neck: no JVD, carotid bruits, or masses Cardiac: RRR; no murmurs, rubs, or gallops,no edema  Respiratory:  clear to auscultation bilaterally, normal work of breathing GI: soft, nontender, nondistended, + BS MS: no deformity or atrophy  Skin: warm and dry Neuro:  Strength and sensation are intact Psych: euthymic mood, full affect  EKG:  EKG is ordered today. Personal review of the ekg ordered shows sinus rhythm, rate 73  Recent Labs: No results found for requested labs within last 8760 hours.    Lipid Panel  No results found for: CHOL, TRIG, HDL, CHOLHDL, VLDL, LDLCALC, LDLDIRECT   Wt Readings from Last 3 Encounters:  02/11/19 143 lb 12.8 oz (65.2 kg)  11/09/18 146 lb (66.2 kg)  04/10/18 144 lb (65.3 kg)      Other studies Reviewed: Additional studies/ records that were reviewed today include: TTE 09/14/16  Review of the above records today demonstrates:  - Left ventricle: The cavity size was normal. Systolic function was   vigorous. The estimated ejection fraction was in the range of 65%   to 70%. Wall motion was normal; there were no regional wall   motion abnormalities. Doppler parameters are consistent with   abnormal  left ventricular relaxation (grade 1 diastolic   dysfunction). - Aortic valve: There was mild regurgitation. - Aorta: Aortic root dimension: 39 mm (ED). - Ascending aorta: The ascending aorta was mildly dilated. - Mitral valve: There was mild regurgitation.  ETT 08/25/16 - personally reviewed  Blood pressure demonstrated a normal response to exercise.  There was no ST segment deviation noted during stress.  Clinically and electrically negative for ischemia Excellent exercise tolerance.  Monitor 10/09/16 - personally reviewed  Parox AFIB detected (3%), occasional tachycardia.  Post conversion pauses noted (none >3 seconds)   ASSESSMENT AND PLAN:  1.  Paroxysmal atrial fibrillation: He on Eliquis status post AF ablation 01/04/2018.  He currently feels well without any further episodes of atrial fibrillation.  He wishes to get off of his Eliquis.  I  have told him that is okay to hold his Eliquis for now.  If he does have any more episodes of atrial fibrillation, he Kenyata Guess restart it.  He is aware of his atrial fibrillation and also has an apple watch which alerts him to arrhythmias.  This patients CHA2DS2-VASc Score and unadjusted Ischemic Stroke Rate (% per year) is equal to 2.2 % stroke rate/year from a score of 2  Above score calculated as 1 point each if present [CHF, HTN, DM, Vascular=MI/PAD/Aortic Plaque, Age if 65-74, or Male] Above score calculated as 2 points each if present [Age > 75, or Stroke/TIA/TE]   2. Hyperlipidemia: Continue pravastatin per primary cardiology.  3. Coronary artery disease: 30% RCA lesion.  No chest pain.  No changes.  Current medicines are reviewed at length with the patient today.   The patient does not have concerns regarding his medicines.  The following changes were made today: Stop Eliquis  Labs/ tests ordered today include:  Orders Placed This Encounter  Procedures  . EKG 12-Lead     Disposition:   FU with Kenya Kook 6 months  Signed,  Jaydee Ingman Meredith Leeds, MD  02/11/2019 11:13 AM     [facility] [address] [place] [address] [phone] (office) [phone] (fax)

## 2019-02-11 NOTE — Patient Instructions (Signed)
Medication Instructions:  Your physician has recommended you make the following change in your medication:  1. STOP Eliquis  * If you need a refill on your cardiac medications before your next appointment, please call your pharmacy.   Labwork: None ordered  Testing/Procedures: None ordered  Follow-Up: At [facility], you and your health needs are our priority.  As part of our continuing mission to provide you with exceptional heart care, we have created designated Provider Care Teams.  These Care Teams include your primary Cardiologist (physician) and Advanced Practice Providers (APPs -  Physician Assistants and Nurse Practitioners) who all work together to provide you with the care you need, when you need it.  You will need a follow up appointment in 6 months with Dr. Curt Bears.  Please call our office 2 months in advance to schedule this appointment.  You may see Dr Curt Bears or one of the following Advanced Practice Providers on your designated Care Team:    Chanetta Marshall, NP  Tommye Standard, PA-C  Oda Kilts, [place]  Thank you for choosing [facility]!!   Trinidad Curet, RN [phone]

## 2019-04-09 DIAGNOSIS — J00 Acute nasopharyngitis [common cold]: Secondary | ICD-10-CM | POA: Diagnosis not present

## 2019-04-09 DIAGNOSIS — Z20828 Contact with and (suspected) exposure to other viral communicable diseases: Secondary | ICD-10-CM | POA: Diagnosis not present

## 2019-05-18 ENCOUNTER — Ambulatory Visit: Payer: Medicare Other | Attending: Internal Medicine

## 2019-05-18 DIAGNOSIS — Z23 Encounter for immunization: Secondary | ICD-10-CM

## 2019-05-18 NOTE — Progress Notes (Signed)
   Covid-19 Vaccination Clinic  Name:  Devin Rosales    MRN: [id number] DOB: 01-29-1946  05/18/2019  Devin Rosales was observed post Covid-19 immunization for 15 minutes without incidence. He was provided with Vaccine Information Sheet and instruction to access the V-Safe system.   Devin Rosales was instructed to call 911 with any severe reactions post vaccine: Marland Kitchen Difficulty breathing  . Swelling of your face and throat  . A fast heartbeat  . A bad rash all over your body  . Dizziness and weakness    Immunizations Administered    Name Date Dose VIS Date Route   Pfizer COVID-19 Vaccine 05/18/2019 12:39 PM 0.3 mL 04/05/2019 Intramuscular   Manufacturer: [place]   Lot: [id number]   [place]: [id number]

## 2019-06-02 ENCOUNTER — Ambulatory Visit: Payer: Medicare Other

## 2019-06-08 ENCOUNTER — Ambulatory Visit: Payer: Medicare Other | Attending: Internal Medicine

## 2019-06-08 DIAGNOSIS — Z23 Encounter for immunization: Secondary | ICD-10-CM | POA: Insufficient documentation

## 2019-06-08 NOTE — Progress Notes (Signed)
   Covid-19 Vaccination Clinic  Name:  Devin Rosales    MRN: [id number] DOB: 1945-06-28  06/08/2019  Devin Rosales was observed post Covid-19 immunization for 15 minutes without incidence. He was provided with Vaccine Information Sheet and instruction to access the V-Safe system.   Devin Rosales was instructed to call 911 with any severe reactions post vaccine: Marland Kitchen Difficulty breathing  . Swelling of your face and throat  . A fast heartbeat  . A bad rash all over your body  . Dizziness and weakness    Immunizations Administered    Name Date Dose VIS Date Route   Pfizer COVID-19 Vaccine 06/08/2019 11:13 AM 0.3 mL 04/05/2019 Intramuscular   Manufacturer: [place]   Lot: [id number]   [place]: [id number]

## 2019-08-13 ENCOUNTER — Ambulatory Visit (INDEPENDENT_AMBULATORY_CARE_PROVIDER_SITE_OTHER): Payer: Medicare Other | Admitting: Cardiology

## 2019-08-13 ENCOUNTER — Encounter: Payer: Self-pay | Admitting: Cardiology

## 2019-08-13 ENCOUNTER — Other Ambulatory Visit: Payer: Self-pay

## 2019-08-13 VITALS — BP 126/74 | HR 72 | Ht 65.0 in | Wt 143.0 lb

## 2019-08-13 DIAGNOSIS — I480 Paroxysmal atrial fibrillation: Secondary | ICD-10-CM

## 2019-08-13 NOTE — Progress Notes (Signed)
Electrophysiology Office Note   Date:  08/13/2019   ID:  Devin Rosales, DOB 07-10-1945, MRN [id number]  PCP:  Lavone Orn, MD  Cardiologist:  Marlou Porch Primary Electrophysiologist:  Chrisanne Loose Meredith Leeds, MD    No chief complaint on file.    History of Present Illness: Devin Rosales is a 74 y.o. male who is being seen today for the evaluation of atrial fibrillation at the request of Lavone Orn, MD. Presenting today for electrophysiology evaluation. He was diagnosed with atrial fibrillation in the spring of 2018. He noted that when he was skiing, his heart was beating very fast. He had waking up at night feeling vibrations in his chest as well as strange sensations in his throat. Since then when exercising, such as pushing a lawnmower, his heart would take off quickly for 5-10 minutes. He felt a strain sensation in the center of his chest. His apical wall showed heart rates at 192 bpm at times. Usually when he is likely he achieved heart rates in the 130s to 140s.  He presented to the emergency room on 12/01/16 with palpitations, found to have a heart rate in the 160s. He converted to sinus rhythm while in the emergency room.  He subsequently had ablation for his atrial fibrillation 01/04/2018.  Today, denies symptoms of palpitations, chest pain, shortness of breath, orthopnea, PND, lower extremity edema, claudication, dizziness, presyncope, syncope, bleeding, or neurologic sequela. The patient is tolerating medications without difficulties. He has had no further episodes of atrial fibrillation since his ablation. He currently feels well without complaint. He is able to do all of his daily activities without restriction. He has continued to travel and went skiing this past winter and tolerated that well.   Past Medical History:  Diagnosis Date  . BPH (benign prostatic hyperplasia)   . CAD (coronary artery disease)   . Esophageal spasm   . Fast heart beat   . GERD (gastroesophageal reflux  disease)   . Hx of insomnia   . Hx of vertigo    BENIGN POSITIONAL  . Hypercholesterolemia   . Hyperlipidemia   . Irritable bowel syndrome   . Oral herpes simplex infection   . Prostatitis 1998  . Seasonal allergic rhinitis   . Spermatocele    RIGHT   Past Surgical History:  Procedure Laterality Date  . APPENDECTOMY    . ATRIAL FIBRILLATION ABLATION N/A 01/04/2018   Procedure: ATRIAL FIBRILLATION ABLATION;  Surgeon: Constance Haw, MD;  Location: [facility];  Service: Cardiovascular;  Laterality: N/A;  . HERNIA REPAIR    . TONSILLECTOMY    . TREATMENT FISTULA ANAL       Current Outpatient Medications  Medication Sig Dispense Refill  . calcium carbonate (TUMS - DOSED IN MG ELEMENTAL CALCIUM) 500 MG chewable tablet Chew 1 tablet by mouth daily as needed for indigestion or heartburn.    . Coenzyme Q10 (COQ10) 100 MG CAPS Take 100 mg by mouth every evening.    . diltiazem (CARDIZEM) 30 MG tablet TAKE 1 TABLET BY MOUTH DAILY AS NEEDED FOR RAPID BREAKTHROUGH PALPITATIONS 30 tablet 10  . fluticasone (FLONASE) 50 MCG/ACT nasal spray Place 1-2 sprays into both nostrils See admin instructions. Use 2 sprays in the morning in each nare and 1 spray in each nare at night as needed for allergys    . Ketotifen Fumarate (ALLERGY EYE DROPS OP) Place 1 drop into both eyes daily as needed (allergies).    . Multiple Vitamin (MULTIVITAMIN WITH MINERALS) TABS  tablet Take 1 tablet by mouth every morning.     . pravastatin (PRAVACHOL) 40 MG tablet Take 40 mg by mouth every evening.     . pseudoephedrine (SUDAFED) 30 MG tablet Take 30 mg by mouth every 4 (four) hours as needed (sinus headaches).    . tamsulosin (FLOMAX) 0.4 MG CAPS capsule Take 0.4 mg by mouth daily as needed (slow urine flow).   0  . valACYclovir (VALTREX) 1000 MG tablet Take 2,000 mg by mouth 2 (two) times daily as needed (fever blisters).     No current facility-administered medications for this visit.    Allergies:    Crestor [rosuvastatin calcium], Lipitor [atorvastatin], Other, Vytorin [ezetimibe-simvastatin], and Zocor [simvastatin]   Social History:  The patient  reports that he has never smoked. He has never used smokeless tobacco. He reports current alcohol use. He reports that he does not use drugs.   Family History:  The patient's family history includes Breast cancer in his mother; Hypertension in his brother; Prostate cancer in his brother.   ROS:  Please see the history of present illness.   Otherwise, review of systems is positive for none.   All other systems are reviewed and negative.   PHYSICAL EXAM: VS:  BP 126/74   Pulse 72   Ht 5\' 5"  (1.651 m)   Wt 143 lb (64.9 kg)   SpO2 98%   BMI 23.80 kg/m  , BMI Body mass index is 23.8 kg/m. GEN: Well nourished, well developed, in no acute distress  HEENT: normal  Neck: no JVD, carotid bruits, or masses Cardiac: RRR; no murmurs, rubs, or gallops,no edema  Respiratory:  clear to auscultation bilaterally, normal work of breathing GI: soft, nontender, nondistended, + BS MS: no deformity or atrophy  Skin: warm and dry Neuro:  Strength and sensation are intact Psych: euthymic mood, full affect  EKG:  EKG is ordered today. Personal review of the ekg ordered  shows sinus rhythm, rate 72  Recent Labs: No results found for requested labs within last 8760 hours.    Lipid Panel  No results found for: CHOL, TRIG, HDL, CHOLHDL, VLDL, LDLCALC, LDLDIRECT   Wt Readings from Last 3 Encounters:  08/13/19 143 lb (64.9 kg)  02/11/19 143 lb 12.8 oz (65.2 kg)  11/09/18 146 lb (66.2 kg)      Other studies Reviewed: Additional studies/ records that were reviewed today include: TTE 09/14/16  Review of the above records today demonstrates:  - Left ventricle: The cavity size was normal. Systolic function was   vigorous. The estimated ejection fraction was in the range of 65%   to 70%. Wall motion was normal; there were no regional wall   motion  abnormalities. Doppler parameters are consistent with   abnormal left ventricular relaxation (grade 1 diastolic   dysfunction). - Aortic valve: There was mild regurgitation. - Aorta: Aortic root dimension: 39 mm (ED). - Ascending aorta: The ascending aorta was mildly dilated. - Mitral valve: There was mild regurgitation.  ETT 08/25/16 - personally reviewed  Blood pressure demonstrated a normal response to exercise.  There was no ST segment deviation noted during stress.  Clinically and electrically negative for ischemia Excellent exercise tolerance.  Monitor 10/09/16 - personally reviewed  Parox AFIB detected (3%), occasional tachycardia.  Post conversion pauses noted (none >3 seconds)   ASSESSMENT AND PLAN:  1.  Paroxysmal atrial fibrillation:  Status post AF ablation 01/04/2018.  CHA2DS2-VASc of 2.  Has not had any further episodes of atrial fibrillation  and thus he wished to stop his Eliquis.  He Keagan Anthis restart if he finds more atrial fibrillation on apple watch.     2. Hyperlipidemia: Continue pravastatin per primary cardiology.  3. Coronary artery disease: 30% RCA lesion.  No chest pain.  No changes at this time.  Current medicines are reviewed at length with the patient today.   The patient does not have concerns regarding his medicines.  The following changes were made today: none  Labs/ tests ordered today include:  Orders Placed This Encounter  Procedures  . EKG 12-Lead     Disposition:   FU with Pamela Intrieri 6 months  Signed, Carlisia Geno Meredith Leeds, MD  08/13/2019 11:47 AM     [facility] [address] [place] [address] [phone] (office) [phone] (fax)

## 2019-09-20 DIAGNOSIS — Z1389 Encounter for screening for other disorder: Secondary | ICD-10-CM | POA: Diagnosis not present

## 2019-09-20 DIAGNOSIS — Z0000 Encounter for general adult medical examination without abnormal findings: Secondary | ICD-10-CM | POA: Diagnosis not present

## 2019-11-30 DIAGNOSIS — Z03818 Encounter for observation for suspected exposure to other biological agents ruled out: Secondary | ICD-10-CM | POA: Diagnosis not present

## 2019-11-30 DIAGNOSIS — Z1152 Encounter for screening for COVID-19: Secondary | ICD-10-CM | POA: Diagnosis not present

## 2019-11-30 DIAGNOSIS — R519 Headache, unspecified: Secondary | ICD-10-CM | POA: Diagnosis not present

## 2020-01-03 DIAGNOSIS — N400 Enlarged prostate without lower urinary tract symptoms: Secondary | ICD-10-CM | POA: Diagnosis not present

## 2020-01-03 DIAGNOSIS — E7800 Pure hypercholesterolemia, unspecified: Secondary | ICD-10-CM | POA: Diagnosis not present

## 2020-01-03 DIAGNOSIS — I2510 Atherosclerotic heart disease of native coronary artery without angina pectoris: Secondary | ICD-10-CM | POA: Diagnosis not present

## 2020-01-03 DIAGNOSIS — I480 Paroxysmal atrial fibrillation: Secondary | ICD-10-CM | POA: Diagnosis not present

## 2020-01-03 DIAGNOSIS — K219 Gastro-esophageal reflux disease without esophagitis: Secondary | ICD-10-CM | POA: Diagnosis not present

## 2020-01-08 DIAGNOSIS — H52203 Unspecified astigmatism, bilateral: Secondary | ICD-10-CM | POA: Diagnosis not present

## 2020-01-08 DIAGNOSIS — Z961 Presence of intraocular lens: Secondary | ICD-10-CM | POA: Diagnosis not present

## 2020-01-17 DIAGNOSIS — Z23 Encounter for immunization: Secondary | ICD-10-CM | POA: Diagnosis not present

## 2020-02-07 DIAGNOSIS — Z23 Encounter for immunization: Secondary | ICD-10-CM | POA: Diagnosis not present

## 2020-03-24 ENCOUNTER — Other Ambulatory Visit: Payer: Self-pay

## 2020-03-24 ENCOUNTER — Encounter: Payer: Self-pay | Admitting: Cardiology

## 2020-03-24 ENCOUNTER — Ambulatory Visit (INDEPENDENT_AMBULATORY_CARE_PROVIDER_SITE_OTHER): Payer: Medicare Other | Admitting: Cardiology

## 2020-03-24 VITALS — BP 122/80 | HR 72 | Ht 65.0 in | Wt 143.0 lb

## 2020-03-24 DIAGNOSIS — I480 Paroxysmal atrial fibrillation: Secondary | ICD-10-CM | POA: Diagnosis not present

## 2020-03-24 NOTE — Progress Notes (Signed)
Electrophysiology Office Note   Date:  03/24/2020   ID:  Devin Rosales, DOB Apr 04, 1946, MRN [id number]  PCP:  Devin Orn, MD  Cardiologist:  Devin Rosales Primary Electrophysiologist:  Devin Flom Meredith Leeds, MD    No chief complaint on file.    History of Present Illness: Devin Rosales is a 74 y.o. male who is being seen today for the evaluation of atrial fibrillation at the request of Devin Orn, MD. Presenting today for electrophysiology evaluation.   He was diagnosed with atrial fibrillation in the spring 2018.  He was skiing at the time.  He noted that his heart rate was very fast.  He noted that when he exercises after that, his heart rate would get very fast.  He presented to the emergency room 12/01/2016 with palpitations and was found to have heart rates in the 160s.  He converted to sinus rhythm while in the emergency room.  He is status post AF ablation 01/04/2018.  Today, denies symptoms of palpitations, chest pain, shortness of breath, orthopnea, PND, lower extremity edema, claudication, dizziness, presyncope, syncope, bleeding, or neurologic sequela. The patient is tolerating medications without difficulties.  He has had no further episodes of atrial fibrillation.  He continues to do well and is without complaint.  He is excited about the ski season, and has multiple trips planned this year.   Past Medical History:  Diagnosis Date  . BPH (benign prostatic hyperplasia)   . CAD (coronary artery disease)   . Esophageal spasm   . Fast heart beat   . GERD (gastroesophageal reflux disease)   . Hx of insomnia   . Hx of vertigo    BENIGN POSITIONAL  . Hypercholesterolemia   . Hyperlipidemia   . Irritable bowel syndrome   . Oral herpes simplex infection   . Prostatitis 1998  . Seasonal allergic rhinitis   . Spermatocele    RIGHT   Past Surgical History:  Procedure Laterality Date  . APPENDECTOMY    . ATRIAL FIBRILLATION ABLATION N/A 01/04/2018   Procedure: ATRIAL  FIBRILLATION ABLATION;  Surgeon: Devin Haw, MD;  Location: [facility];  Service: Cardiovascular;  Laterality: N/A;  . HERNIA REPAIR    . TONSILLECTOMY    . TREATMENT FISTULA ANAL       Current Outpatient Medications  Medication Sig Dispense Refill  . calcium carbonate (TUMS - DOSED IN MG ELEMENTAL CALCIUM) 500 MG chewable tablet Chew 1 tablet by mouth daily as needed for indigestion or heartburn.    . Coenzyme Q10 (COQ10) 100 MG CAPS Take 100 mg by mouth every evening.    . diltiazem (CARDIZEM) 30 MG tablet TAKE 1 TABLET BY MOUTH DAILY AS NEEDED FOR RAPID BREAKTHROUGH PALPITATIONS 30 tablet 10  . fluticasone (FLONASE) 50 MCG/ACT nasal spray Place 1-2 sprays into both nostrils See admin instructions. Use 2 sprays in the morning in each nare and 1 spray in each nare at night as needed for allergys    . Ketotifen Fumarate (ALLERGY EYE DROPS OP) Place 1 drop into both eyes daily as needed (allergies).    . Multiple Vitamin (MULTIVITAMIN WITH MINERALS) TABS tablet Take 1 tablet by mouth every morning.     . pravastatin (PRAVACHOL) 40 MG tablet Take 40 mg by mouth every evening.     . pseudoephedrine (SUDAFED) 30 MG tablet Take 30 mg by mouth every 4 (four) hours as needed (sinus headaches).    . tamsulosin (FLOMAX) 0.4 MG CAPS capsule Take 0.4 mg by mouth  daily as needed (slow urine flow).   0  . valACYclovir (VALTREX) 1000 MG tablet Take 2,000 mg by mouth 2 (two) times daily as needed (fever blisters).    . ezetimibe (ZETIA) 10 MG tablet Take 10 mg by mouth daily.     No current facility-administered medications for this visit.    Allergies:   Crestor [rosuvastatin calcium], Lipitor [atorvastatin], Other, Vytorin [ezetimibe-simvastatin], and Zocor [simvastatin]   Social History:  The patient  reports that he has never smoked. He has never used smokeless tobacco. He reports current alcohol use. He reports that he does not use drugs.   Family History:  The patient's family  history includes Breast cancer in his mother; Hypertension in his brother; Prostate cancer in his brother.   ROS:  Please see the history of present illness.   Otherwise, review of systems is positive for none.   All other systems are reviewed and negative.   PHYSICAL EXAM: VS:  BP 122/80   Pulse 72   Ht 5\' 5"  (1.651 m)   Wt 143 lb (64.9 kg)   SpO2 99%   BMI 23.80 kg/m  , BMI Body mass index is 23.8 kg/m. GEN: Well nourished, well developed, in no acute distress  HEENT: normal  Neck: no JVD, carotid bruits, or masses Cardiac: RRR; no murmurs, rubs, or gallops,no edema  Respiratory:  clear to auscultation bilaterally, normal work of breathing GI: soft, nontender, nondistended, + BS MS: no deformity or atrophy  Skin: warm and dry Neuro:  Strength and sensation are intact Psych: euthymic mood, full affect  EKG:  EKG is ordered today. Personal review of the ekg ordered shows sinus rhythm, rate 72  Recent Labs: No results found for requested labs within last 8760 hours.    Lipid Panel  No results found for: CHOL, TRIG, HDL, CHOLHDL, VLDL, LDLCALC, LDLDIRECT   Wt Readings from Last 3 Encounters:  03/24/20 143 lb (64.9 kg)  08/13/19 143 lb (64.9 kg)  02/11/19 143 lb 12.8 oz (65.2 kg)      Other studies Reviewed: Additional studies/ records that were reviewed today include: TTE 09/14/16  Review of the above records today demonstrates:  - Left ventricle: The cavity size was normal. Systolic function was   vigorous. The estimated ejection fraction was in the range of 65%   to 70%. Wall motion was normal; there were no regional wall   motion abnormalities. Doppler parameters are consistent with   abnormal left ventricular relaxation (grade 1 diastolic   dysfunction). - Aortic valve: There was mild regurgitation. - Aorta: Aortic root dimension: 39 mm (ED). - Ascending aorta: The ascending aorta was mildly dilated. - Mitral valve: There was mild regurgitation.  ETT 08/25/16 -  personally reviewed  Blood pressure demonstrated a normal response to exercise.  There was no ST segment deviation noted during stress.  Clinically and electrically negative for ischemia Excellent exercise tolerance.  Monitor 10/09/16 - personally reviewed  Parox AFIB detected (3%), occasional tachycardia.  Post conversion pauses noted (none >3 seconds)   ASSESSMENT AND PLAN:  1.  Paroxysmal atrial fibrillation: Status post AF ablation 01/04/2018.  CHA2DS2-VASc of 2.  Has had no further episodes of atrial fibrillation and thus is not on Eliquis.  Devin Rosales restart if he has more frequent episodes noted on apple watch.    2.  Hyperlipidemia: Continue pravastatin per primary cardiology.  3.  Coronary artery disease: 30% RCA lesion.  No chest pain.  Continue with current management..  Current medicines  are reviewed at length with the patient today.   The patient does not have concerns regarding his medicines.  The following changes were made today: none  Labs/ tests ordered today include:  Orders Placed This Encounter  Procedures  . EKG 12-Lead     Disposition:   FU with Devin Rosales 12 months  Signed, Marlette Curvin Meredith Leeds, MD  03/24/2020 10:15 AM     [facility] [address] [place] [address] [phone] (office) [phone] (fax)

## 2020-03-24 NOTE — Patient Instructions (Signed)
Medication Instructions:  °Your physician recommends that you continue on your current medications as directed. Please refer to the Current Medication list given to you today. ° °*If you need a refill on your cardiac medications before your next appointment, please call your pharmacy* ° ° °Lab Work: °None ordered ° ° °Testing/Procedures: °None ordered ° ° °Follow-Up: °At [facility], you and your health needs are our priority.  As part of our continuing mission to provide you with exceptional heart care, we have created designated Provider Care Teams.  These Care Teams include your primary Cardiologist (physician) and Advanced Practice Providers (APPs -  Physician Assistants and Nurse Practitioners) who all work together to provide you with the care you need, when you need it. ° °Your next appointment:   °1 year(s) ° °The format for your next appointment:   °In Person ° °Provider:   °Will Camnitz, MD ° ° ° °Thank you for choosing [facility]!! ° ° °Jaiden Wahab, RN °[phone] ° ° ° °

## 2020-03-27 DIAGNOSIS — I2510 Atherosclerotic heart disease of native coronary artery without angina pectoris: Secondary | ICD-10-CM | POA: Diagnosis not present

## 2020-03-27 DIAGNOSIS — I480 Paroxysmal atrial fibrillation: Secondary | ICD-10-CM | POA: Diagnosis not present

## 2020-03-27 DIAGNOSIS — N400 Enlarged prostate without lower urinary tract symptoms: Secondary | ICD-10-CM | POA: Diagnosis not present

## 2020-03-27 DIAGNOSIS — E7800 Pure hypercholesterolemia, unspecified: Secondary | ICD-10-CM | POA: Diagnosis not present

## 2020-03-27 DIAGNOSIS — K219 Gastro-esophageal reflux disease without esophagitis: Secondary | ICD-10-CM | POA: Diagnosis not present

## 2020-06-15 DIAGNOSIS — I2510 Atherosclerotic heart disease of native coronary artery without angina pectoris: Secondary | ICD-10-CM | POA: Diagnosis not present

## 2020-06-15 DIAGNOSIS — E7800 Pure hypercholesterolemia, unspecified: Secondary | ICD-10-CM | POA: Diagnosis not present

## 2020-06-15 DIAGNOSIS — K219 Gastro-esophageal reflux disease without esophagitis: Secondary | ICD-10-CM | POA: Diagnosis not present

## 2020-06-15 DIAGNOSIS — N400 Enlarged prostate without lower urinary tract symptoms: Secondary | ICD-10-CM | POA: Diagnosis not present

## 2020-06-15 DIAGNOSIS — I480 Paroxysmal atrial fibrillation: Secondary | ICD-10-CM | POA: Diagnosis not present

## 2020-08-06 DIAGNOSIS — Z23 Encounter for immunization: Secondary | ICD-10-CM | POA: Diagnosis not present

## 2020-08-24 DIAGNOSIS — I480 Paroxysmal atrial fibrillation: Secondary | ICD-10-CM | POA: Diagnosis not present

## 2020-08-24 DIAGNOSIS — K219 Gastro-esophageal reflux disease without esophagitis: Secondary | ICD-10-CM | POA: Diagnosis not present

## 2020-08-24 DIAGNOSIS — I2510 Atherosclerotic heart disease of native coronary artery without angina pectoris: Secondary | ICD-10-CM | POA: Diagnosis not present

## 2020-08-24 DIAGNOSIS — E7800 Pure hypercholesterolemia, unspecified: Secondary | ICD-10-CM | POA: Diagnosis not present

## 2020-08-24 DIAGNOSIS — N400 Enlarged prostate without lower urinary tract symptoms: Secondary | ICD-10-CM | POA: Diagnosis not present

## 2020-09-23 DIAGNOSIS — Z125 Encounter for screening for malignant neoplasm of prostate: Secondary | ICD-10-CM | POA: Diagnosis not present

## 2020-09-23 DIAGNOSIS — N400 Enlarged prostate without lower urinary tract symptoms: Secondary | ICD-10-CM | POA: Diagnosis not present

## 2020-09-23 DIAGNOSIS — Z23 Encounter for immunization: Secondary | ICD-10-CM | POA: Diagnosis not present

## 2020-09-23 DIAGNOSIS — R972 Elevated prostate specific antigen [PSA]: Secondary | ICD-10-CM | POA: Diagnosis not present

## 2020-09-23 DIAGNOSIS — Z0000 Encounter for general adult medical examination without abnormal findings: Secondary | ICD-10-CM | POA: Diagnosis not present

## 2020-09-23 DIAGNOSIS — E7800 Pure hypercholesterolemia, unspecified: Secondary | ICD-10-CM | POA: Diagnosis not present

## 2020-09-23 DIAGNOSIS — I2510 Atherosclerotic heart disease of native coronary artery without angina pectoris: Secondary | ICD-10-CM | POA: Diagnosis not present

## 2020-09-23 DIAGNOSIS — J302 Other seasonal allergic rhinitis: Secondary | ICD-10-CM | POA: Diagnosis not present

## 2020-09-23 DIAGNOSIS — Z1389 Encounter for screening for other disorder: Secondary | ICD-10-CM | POA: Diagnosis not present

## 2020-09-23 DIAGNOSIS — I480 Paroxysmal atrial fibrillation: Secondary | ICD-10-CM | POA: Diagnosis not present

## 2020-10-01 DIAGNOSIS — I480 Paroxysmal atrial fibrillation: Secondary | ICD-10-CM | POA: Diagnosis not present

## 2020-10-01 DIAGNOSIS — K219 Gastro-esophageal reflux disease without esophagitis: Secondary | ICD-10-CM | POA: Diagnosis not present

## 2020-10-01 DIAGNOSIS — N400 Enlarged prostate without lower urinary tract symptoms: Secondary | ICD-10-CM | POA: Diagnosis not present

## 2020-10-01 DIAGNOSIS — E7800 Pure hypercholesterolemia, unspecified: Secondary | ICD-10-CM | POA: Diagnosis not present

## 2020-10-01 DIAGNOSIS — I2510 Atherosclerotic heart disease of native coronary artery without angina pectoris: Secondary | ICD-10-CM | POA: Diagnosis not present

## 2021-01-12 DIAGNOSIS — Z961 Presence of intraocular lens: Secondary | ICD-10-CM | POA: Diagnosis not present

## 2021-01-12 DIAGNOSIS — H52203 Unspecified astigmatism, bilateral: Secondary | ICD-10-CM | POA: Diagnosis not present

## 2021-02-16 DIAGNOSIS — Z23 Encounter for immunization: Secondary | ICD-10-CM | POA: Diagnosis not present

## 2021-02-21 DIAGNOSIS — Z20822 Contact with and (suspected) exposure to covid-19: Secondary | ICD-10-CM | POA: Diagnosis not present

## 2021-02-25 DIAGNOSIS — I480 Paroxysmal atrial fibrillation: Secondary | ICD-10-CM | POA: Diagnosis not present

## 2021-02-25 DIAGNOSIS — K219 Gastro-esophageal reflux disease without esophagitis: Secondary | ICD-10-CM | POA: Diagnosis not present

## 2021-02-25 DIAGNOSIS — N400 Enlarged prostate without lower urinary tract symptoms: Secondary | ICD-10-CM | POA: Diagnosis not present

## 2021-02-25 DIAGNOSIS — I2510 Atherosclerotic heart disease of native coronary artery without angina pectoris: Secondary | ICD-10-CM | POA: Diagnosis not present

## 2021-02-25 DIAGNOSIS — E7800 Pure hypercholesterolemia, unspecified: Secondary | ICD-10-CM | POA: Diagnosis not present

## 2021-03-12 DIAGNOSIS — R972 Elevated prostate specific antigen [PSA]: Secondary | ICD-10-CM | POA: Diagnosis not present

## 2021-03-24 DIAGNOSIS — G43909 Migraine, unspecified, not intractable, without status migrainosus: Secondary | ICD-10-CM | POA: Diagnosis not present

## 2021-03-24 DIAGNOSIS — H0100B Unspecified blepharitis left eye, upper and lower eyelids: Secondary | ICD-10-CM | POA: Diagnosis not present

## 2021-03-29 DIAGNOSIS — Z20822 Contact with and (suspected) exposure to covid-19: Secondary | ICD-10-CM | POA: Diagnosis not present

## 2021-03-30 ENCOUNTER — Ambulatory Visit: Payer: Medicare Other | Admitting: Cardiology

## 2021-04-29 DIAGNOSIS — Z23 Encounter for immunization: Secondary | ICD-10-CM | POA: Diagnosis not present

## 2021-05-05 ENCOUNTER — Other Ambulatory Visit: Payer: Self-pay

## 2021-05-05 ENCOUNTER — Ambulatory Visit (INDEPENDENT_AMBULATORY_CARE_PROVIDER_SITE_OTHER): Payer: Medicare Other | Admitting: Cardiology

## 2021-05-05 ENCOUNTER — Encounter: Payer: Self-pay | Admitting: Cardiology

## 2021-05-05 VITALS — BP 108/76 | HR 78 | Ht 65.0 in | Wt 142.0 lb

## 2021-05-05 DIAGNOSIS — I480 Paroxysmal atrial fibrillation: Secondary | ICD-10-CM | POA: Diagnosis not present

## 2021-05-05 DIAGNOSIS — E785 Hyperlipidemia, unspecified: Secondary | ICD-10-CM

## 2021-05-05 DIAGNOSIS — I2510 Atherosclerotic heart disease of native coronary artery without angina pectoris: Secondary | ICD-10-CM

## 2021-05-05 NOTE — Progress Notes (Signed)
Electrophysiology Office Note   Date:  05/05/2021   ID:  Devin Rosales, DOB 11/22/1945, MRN [id number]  PCP:  Lavone Orn, MD  Cardiologist:  Marlou Porch Primary Electrophysiologist:  Jalynn Waddell Meredith Leeds, MD    No chief complaint on file.    History of Present Illness: Devin Rosales is a 76 y.o. male who is being seen today for the evaluation of atrial fibrillation at the request of Lavone Orn, MD. Presenting today for electrophysiology evaluation.   He was diagnosed with atrial fibrillation in the spring 2018.  He was skiing at the time.  He noted that his heart rate was very fast.  When he exercised, he would get even more rapid heart rates.  He presented emergency room 12/01/2016 and was found to have heart rates of 160 bpm.  He converted to sinus rhythm while in the emergency room.  He is status post atrial fibrillation ablation 01/04/2018.  Today, denies symptoms of palpitations, chest pain, shortness of breath, orthopnea, PND, lower extremity edema, claudication, dizziness, presyncope, syncope, bleeding, or neurologic sequela. The patient is tolerating medications without difficulties.  He currently feels well.  He has no chest pain or shortness of breath.  He is able to do all of his daily activities.  He has no awareness of further episodes of atrial fibrillation.  He still remains active, skiing multiple times a year, playing golf, and cycling.   Past Medical History:  Diagnosis Date   BPH (benign prostatic hyperplasia)    CAD (coronary artery disease)    Esophageal spasm    Fast heart beat    GERD (gastroesophageal reflux disease)    Hx of insomnia    Hx of vertigo    BENIGN POSITIONAL   Hypercholesterolemia    Hyperlipidemia    Irritable bowel syndrome    Oral herpes simplex infection    Prostatitis 1998   Seasonal allergic rhinitis    Spermatocele    RIGHT   Past Surgical History:  Procedure Laterality Date   APPENDECTOMY     ATRIAL FIBRILLATION ABLATION N/A  01/04/2018   Procedure: ATRIAL FIBRILLATION ABLATION;  Surgeon: Constance Haw, MD;  Location: [place] [facility];  Service: Cardiovascular;  Laterality: N/A;   HERNIA REPAIR     TONSILLECTOMY     TREATMENT FISTULA ANAL       Current Outpatient Medications  Medication Sig Dispense Refill   calcium carbonate (TUMS - DOSED IN MG ELEMENTAL CALCIUM) 500 MG chewable tablet Chew 1 tablet by mouth daily as needed for indigestion or heartburn.     Coenzyme Q10 (COQ10) 100 MG CAPS Take 100 mg by mouth every evening.     diltiazem (CARDIZEM) 30 MG tablet TAKE 1 TABLET BY MOUTH DAILY AS NEEDED FOR RAPID BREAKTHROUGH PALPITATIONS 30 tablet 10   ezetimibe (ZETIA) 10 MG tablet Take 10 mg by mouth daily.     fluticasone (FLONASE) 50 MCG/ACT nasal spray Place 1-2 sprays into both nostrils See admin instructions. Use 2 sprays in the morning in each nare and 1 spray in each nare at night as needed for allergys     Ketotifen Fumarate (ALLERGY EYE DROPS OP) Place 1 drop into both eyes daily as needed (allergies).     Multiple Vitamin (MULTIVITAMIN WITH MINERALS) TABS tablet Take 1 tablet by mouth every morning.      pravastatin (PRAVACHOL) 40 MG tablet Take 40 mg by mouth every evening.      pseudoephedrine (SUDAFED) 30 MG tablet Take 30 mg by  mouth every 4 (four) hours as needed (sinus headaches).     tamsulosin (FLOMAX) 0.4 MG CAPS capsule Take 0.4 mg by mouth daily as needed (slow urine flow).   0   valACYclovir (VALTREX) 1000 MG tablet Take 2,000 mg by mouth 2 (two) times daily as needed (fever blisters).     No current facility-administered medications for this visit.    Allergies:   Crestor [rosuvastatin calcium], Lipitor [atorvastatin], Other, Vytorin [ezetimibe-simvastatin], and Zocor [simvastatin]   Social History:  The patient  reports that he has never smoked. He has never used smokeless tobacco. He reports current alcohol use. He reports that he does not use drugs.   Family History:  The  patient's family history includes Breast cancer in his mother; Hypertension in his brother; Prostate cancer in his brother.   ROS:  Please see the history of present illness.   Otherwise, review of systems is positive for none.   All other systems are reviewed and negative.   PHYSICAL EXAM: VS:  BP 108/76    Pulse 78    Ht 5\' 5"  (1.651 m)    Wt 142 lb (64.4 kg)    SpO2 98%    BMI 23.63 kg/m  , BMI Body mass index is 23.63 kg/m. GEN: Well nourished, well developed, in no acute distress  HEENT: normal  Neck: no JVD, carotid bruits, or masses Cardiac: RRR; no murmurs, rubs, or gallops,no edema  Respiratory:  clear to auscultation bilaterally, normal work of breathing GI: soft, nontender, nondistended, + BS MS: no deformity or atrophy  Skin: warm and dry Neuro:  Strength and sensation are intact Psych: euthymic mood, full affect  EKG:  EKG is ordered today. Personal review of the ekg ordered shows sinus rhythm, rate 78  Recent Labs: No results found for requested labs within last 8760 hours.    Lipid Panel  No results found for: CHOL, TRIG, HDL, CHOLHDL, VLDL, LDLCALC, LDLDIRECT   Wt Readings from Last 3 Encounters:  03/24/20 143 lb (64.9 kg)  08/13/19 143 lb (64.9 kg)  02/11/19 143 lb 12.8 oz (65.2 kg)      Other studies Reviewed: Additional studies/ records that were reviewed today include: TTE 09/14/16  Review of the above records today demonstrates:  - Left ventricle: The cavity size was normal. Systolic function was   vigorous. The estimated ejection fraction was in the range of 65%   to 70%. Wall motion was normal; there were no regional wall   motion abnormalities. Doppler parameters are consistent with   abnormal left ventricular relaxation (grade 1 diastolic   dysfunction). - Aortic valve: There was mild regurgitation. - Aorta: Aortic root dimension: 39 mm (ED). - Ascending aorta: The ascending aorta was mildly dilated. - Mitral valve: There was mild  regurgitation.  ETT 08/25/16 - personally reviewed Blood pressure demonstrated a normal response to exercise. There was no ST segment deviation noted during stress. Clinically and electrically negative for ischemia Excellent exercise tolerance.  Monitor 10/09/16 - personally reviewed Parox AFIB detected (3%), occasional tachycardia. Post conversion pauses noted (none >3 seconds)   ASSESSMENT AND PLAN:  1.  Paroxysmal atrial fibrillation: Status post atrial fibrillation ablation 01/04/2018.  CHA2DS2-VASc of 2.  No further episodes of atrial fibrillation thus not anticoagulated.  Monitoring via apple watch.  No further episodes.  Happy with his control.  2.  Hyperlipidemia: Currently on pravastatin 40 mg daily per primary physician.  Goal LDL less than 70.  3.  Coronary artery disease: 30%  lesion in the RCA.  No current chest pain.  We Mathis Cashman take a baby aspirin daily when he is not skiing.   Current medicines are reviewed at length with the patient today.   The patient does not have concerns regarding his medicines.  The following changes were made today: Baby aspirin  Labs/ tests ordered today include:  No orders of the defined types were placed in this encounter.    Disposition:   FU with Raylinn Kosar 12 months  Signed, Jerimie Mancuso Meredith Leeds, MD  05/05/2021 8:22 AM     [facility] [address] [place] [address] [phone] (office) [phone] (fax)

## 2021-05-06 ENCOUNTER — Ambulatory Visit: Payer: Medicare Other | Admitting: Cardiology

## 2021-07-26 DIAGNOSIS — Z03818 Encounter for observation for suspected exposure to other biological agents ruled out: Secondary | ICD-10-CM | POA: Diagnosis not present

## 2021-07-26 DIAGNOSIS — J069 Acute upper respiratory infection, unspecified: Secondary | ICD-10-CM | POA: Diagnosis not present

## 2021-09-30 DIAGNOSIS — Z23 Encounter for immunization: Secondary | ICD-10-CM | POA: Diagnosis not present

## 2021-10-06 DIAGNOSIS — Z1331 Encounter for screening for depression: Secondary | ICD-10-CM | POA: Diagnosis not present

## 2021-10-06 DIAGNOSIS — I2510 Atherosclerotic heart disease of native coronary artery without angina pectoris: Secondary | ICD-10-CM | POA: Diagnosis not present

## 2021-10-06 DIAGNOSIS — I480 Paroxysmal atrial fibrillation: Secondary | ICD-10-CM | POA: Diagnosis not present

## 2021-10-06 DIAGNOSIS — J302 Other seasonal allergic rhinitis: Secondary | ICD-10-CM | POA: Diagnosis not present

## 2021-10-06 DIAGNOSIS — R972 Elevated prostate specific antigen [PSA]: Secondary | ICD-10-CM | POA: Diagnosis not present

## 2021-10-06 DIAGNOSIS — Z0000 Encounter for general adult medical examination without abnormal findings: Secondary | ICD-10-CM | POA: Diagnosis not present

## 2021-10-06 DIAGNOSIS — N400 Enlarged prostate without lower urinary tract symptoms: Secondary | ICD-10-CM | POA: Diagnosis not present

## 2022-01-18 DIAGNOSIS — H52203 Unspecified astigmatism, bilateral: Secondary | ICD-10-CM | POA: Diagnosis not present

## 2022-01-18 DIAGNOSIS — Z961 Presence of intraocular lens: Secondary | ICD-10-CM | POA: Diagnosis not present

## 2022-02-22 DIAGNOSIS — Z23 Encounter for immunization: Secondary | ICD-10-CM | POA: Diagnosis not present

## 2022-08-23 DIAGNOSIS — L820 Inflamed seborrheic keratosis: Secondary | ICD-10-CM | POA: Diagnosis not present

## 2022-08-23 DIAGNOSIS — L280 Lichen simplex chronicus: Secondary | ICD-10-CM | POA: Diagnosis not present

## 2022-09-23 ENCOUNTER — Encounter: Payer: Self-pay | Admitting: Cardiology

## 2022-09-23 ENCOUNTER — Ambulatory Visit: Payer: Medicare Other | Attending: Cardiology | Admitting: Cardiology

## 2022-09-23 VITALS — BP 122/70 | HR 69 | Ht 65.0 in | Wt 140.6 lb

## 2022-09-23 DIAGNOSIS — I480 Paroxysmal atrial fibrillation: Secondary | ICD-10-CM | POA: Insufficient documentation

## 2022-09-23 DIAGNOSIS — I2510 Atherosclerotic heart disease of native coronary artery without angina pectoris: Secondary | ICD-10-CM | POA: Insufficient documentation

## 2022-09-23 NOTE — Progress Notes (Signed)
Electrophysiology Office Note   Date:  09/23/2022   ID:  Devin Rosales, DOB June 10, 1945, MRN [id number]  PCP:  Kirby Funk, MD (Inactive)  Cardiologist:  Anne Fu Primary Electrophysiologist:  Horst Ostermiller Jorja Loa, MD    No chief complaint on file.    History of Present Illness: Devin Rosales is a 77 y.o. male who is being seen today for the evaluation of atrial fibrillation at the request of Kirby Funk, MD. Presenting today for electrophysiology evaluation.   He was diagnosed with atrial fibrillation spring 2018.  He was skiing at the time.  He was noted to have heart rates that were very fast.  He is now post ablation 01/04/2018.  He was found to have a 30% RCA lesion is now on aspirin.  Today, denies symptoms of palpitations, chest pain, shortness of breath, orthopnea, PND, lower extremity edema, claudication, dizziness, presyncope, syncope, bleeding, or neurologic sequela. The patient is tolerating medications without difficulties.  Since being seen he has done well.  He has had no further episodes of atrial fibrillation.  Happy with his control.  He is able to exercise without issue.  He continues to vigorously ski and workout.   Past Medical History:  Diagnosis Date   BPH (benign prostatic hyperplasia)    CAD (coronary artery disease)    Esophageal spasm    Fast heart beat    GERD (gastroesophageal reflux disease)    Hx of insomnia    Hx of vertigo    BENIGN POSITIONAL   Hypercholesterolemia    Hyperlipidemia    Irritable bowel syndrome    Oral herpes simplex infection    Prostatitis 1998   Seasonal allergic rhinitis    Spermatocele    RIGHT   Past Surgical History:  Procedure Laterality Date   APPENDECTOMY     ATRIAL FIBRILLATION ABLATION N/A 01/04/2018   Procedure: ATRIAL FIBRILLATION ABLATION;  Surgeon: Regan Lemming, MD;  Location: [facility];  Service: Cardiovascular;  Laterality: N/A;   HERNIA REPAIR     TONSILLECTOMY     TREATMENT FISTULA ANAL        Current Outpatient Medications  Medication Sig Dispense Refill   aspirin EC 81 MG tablet Take 81 mg by mouth daily. Swallow whole.     calcium carbonate (TUMS - DOSED IN MG ELEMENTAL CALCIUM) 500 MG chewable tablet Chew 1 tablet by mouth daily as needed for indigestion or heartburn.     Coenzyme Q10 (COQ10) 100 MG CAPS Take 100 mg by mouth every evening.     ezetimibe (ZETIA) 10 MG tablet Take 10 mg by mouth daily.     fluticasone (FLONASE) 50 MCG/ACT nasal spray Place 1-2 sprays into both nostrils See admin instructions. Use 2 sprays in the morning in each nare and 1 spray in each nare at night as needed for allergys     Ketotifen Fumarate (ALLERGY EYE DROPS OP) Place 1 drop into both eyes daily as needed (allergies).     Multiple Vitamin (MULTIVITAMIN WITH MINERALS) TABS tablet Take 1 tablet by mouth every morning.      pravastatin (PRAVACHOL) 40 MG tablet Take 40 mg by mouth every evening.      pseudoephedrine (SUDAFED) 30 MG tablet Take 30 mg by mouth every 4 (four) hours as needed (sinus headaches).     tamsulosin (FLOMAX) 0.4 MG CAPS capsule Take 0.4 mg by mouth daily as needed (slow urine flow).   0   valACYclovir (VALTREX) 1000 MG tablet Take 2,000 mg  by mouth 2 (two) times daily as needed (fever blisters).     diltiazem (CARDIZEM) 30 MG tablet TAKE 1 TABLET BY MOUTH DAILY AS NEEDED FOR RAPID BREAKTHROUGH PALPITATIONS 30 tablet 10   No current facility-administered medications for this visit.    Allergies:   Crestor [rosuvastatin calcium], Ezetimibe-simvastatin, Lipitor [atorvastatin], Other, and Zocor [simvastatin]   Social History:  The patient  reports that he has never smoked. He has never used smokeless tobacco. He reports current alcohol use. He reports that he does not use drugs.   Family History:  The patient's family history includes Breast cancer in his mother; Hypertension in his brother; Prostate cancer in his brother.   ROS:  Please see the history of present  illness.   Otherwise, review of systems is positive for none.   All other systems are reviewed and negative.   PHYSICAL EXAM: VS:  BP 122/70   Pulse 69   Ht 5\' 5"  (1.651 m)   Wt 140 lb 9.6 oz (63.8 kg)   SpO2 98%   BMI 23.40 kg/m  , BMI Body mass index is 23.4 kg/m. GEN: Well nourished, well developed, in no acute distress  HEENT: normal  Neck: no JVD, carotid bruits, or masses Cardiac: RRR; no murmurs, rubs, or gallops,no edema  Respiratory:  clear to auscultation bilaterally, normal work of breathing GI: soft, nontender, nondistended, + BS MS: no deformity or atrophy  Skin: warm and dry Neuro:  Strength and sensation are intact Psych: euthymic mood, full affect  EKG:  EKG is ordered today. Personal review of the ekg ordered shows sinus rhythm   Recent Labs: No results found for requested labs within last 365 days.    Lipid Panel  No results found for: "CHOL", "TRIG", "HDL", "CHOLHDL", "VLDL", "LDLCALC", "LDLDIRECT"   Wt Readings from Last 3 Encounters:  09/23/22 140 lb 9.6 oz (63.8 kg)  05/05/21 142 lb (64.4 kg)  03/24/20 143 lb (64.9 kg)      Other studies Reviewed: Additional studies/ records that were reviewed today include: TTE 09/14/16  Review of the above records today demonstrates:  - Left ventricle: The cavity size was normal. Systolic function was   vigorous. The estimated ejection fraction was in the range of 65%   to 70%. Wall motion was normal; there were no regional wall   motion abnormalities. Doppler parameters are consistent with   abnormal left ventricular relaxation (grade 1 diastolic   dysfunction). - Aortic valve: There was mild regurgitation. - Aorta: Aortic root dimension: 39 mm (ED). - Ascending aorta: The ascending aorta was mildly dilated. - Mitral valve: There was mild regurgitation.  ETT 08/25/16 - personally reviewed Blood pressure demonstrated a normal response to exercise. There was no ST segment deviation noted during  stress. Clinically and electrically negative for ischemia Excellent exercise tolerance.  Monitor 10/09/16 - personally reviewed Parox AFIB detected (3%), occasional tachycardia. Post conversion pauses noted (none >3 seconds)   ASSESSMENT AND PLAN:  1.  Paroxysmal atrial fibrillation: Status post ablation 01/04/2018.  CHA2DS2-VASc of 2.  No further episodes and thus not anticoagulated.  Remains in sinus rhythm  2.  Hyperlipidemia: Continue pravastatin per primary physician.  Goal LDL less than 70.  3.  Coronary artery disease: 30% RCA lesion.  Currently on baby aspirin.  No current chest pain.    Current medicines are reviewed at length with the patient today.   The patient does not have concerns regarding his medicines.  The following changes were made today:  None  Labs/ tests ordered today include:  Orders Placed This Encounter  Procedures   EKG 12-Lead     Disposition:   FU 12 months  Signed, Dyson Sevey Jorja Loa, MD  09/23/2022 10:17 AM     [facility] [address] [place] [address] [phone] (office) [phone] (fax)

## 2022-10-11 DIAGNOSIS — Z1159 Encounter for screening for other viral diseases: Secondary | ICD-10-CM | POA: Diagnosis not present

## 2022-10-11 DIAGNOSIS — I2510 Atherosclerotic heart disease of native coronary artery without angina pectoris: Secondary | ICD-10-CM | POA: Diagnosis not present

## 2022-10-11 DIAGNOSIS — N400 Enlarged prostate without lower urinary tract symptoms: Secondary | ICD-10-CM | POA: Diagnosis not present

## 2022-10-11 DIAGNOSIS — E7800 Pure hypercholesterolemia, unspecified: Secondary | ICD-10-CM | POA: Diagnosis not present

## 2022-10-11 DIAGNOSIS — I480 Paroxysmal atrial fibrillation: Secondary | ICD-10-CM | POA: Diagnosis not present

## 2022-10-11 DIAGNOSIS — Z1331 Encounter for screening for depression: Secondary | ICD-10-CM | POA: Diagnosis not present

## 2022-10-11 DIAGNOSIS — Z0000 Encounter for general adult medical examination without abnormal findings: Secondary | ICD-10-CM | POA: Diagnosis not present

## 2022-10-11 DIAGNOSIS — G720 Drug-induced myopathy: Secondary | ICD-10-CM | POA: Diagnosis not present

## 2022-10-11 DIAGNOSIS — R972 Elevated prostate specific antigen [PSA]: Secondary | ICD-10-CM | POA: Diagnosis not present

## 2022-12-23 DIAGNOSIS — R972 Elevated prostate specific antigen [PSA]: Secondary | ICD-10-CM | POA: Diagnosis not present

## 2022-12-27 DIAGNOSIS — D225 Melanocytic nevi of trunk: Secondary | ICD-10-CM | POA: Diagnosis not present

## 2022-12-27 DIAGNOSIS — D485 Neoplasm of uncertain behavior of skin: Secondary | ICD-10-CM | POA: Diagnosis not present

## 2022-12-27 DIAGNOSIS — L820 Inflamed seborrheic keratosis: Secondary | ICD-10-CM | POA: Diagnosis not present

## 2022-12-27 DIAGNOSIS — D224 Melanocytic nevi of scalp and neck: Secondary | ICD-10-CM | POA: Diagnosis not present

## 2022-12-30 ENCOUNTER — Other Ambulatory Visit: Payer: Self-pay | Admitting: Urology

## 2022-12-30 DIAGNOSIS — R972 Elevated prostate specific antigen [PSA]: Secondary | ICD-10-CM

## 2023-01-17 ENCOUNTER — Ambulatory Visit
Admission: RE | Admit: 2023-01-17 | Discharge: 2023-01-17 | Disposition: A | Discharge status: Home / Self Care | Payer: Medicare Other | Source: Ambulatory Visit | Attending: Urology | Admitting: Urology

## 2023-01-17 DIAGNOSIS — R972 Elevated prostate specific antigen [PSA]: Secondary | ICD-10-CM

## 2023-01-17 MED ORDER — GADOPICLENOL 0.5 MMOL/ML IV SOLN
7.5000 mL | Freq: Once | INTRAVENOUS | Status: AC | PRN
  Administered 2023-01-17: 7 mL via INTRAVENOUS

## 2023-02-17 DIAGNOSIS — H40013 Open angle with borderline findings, low risk, bilateral: Secondary | ICD-10-CM | POA: Diagnosis not present

## 2023-02-24 DIAGNOSIS — C61 Malignant neoplasm of prostate: Secondary | ICD-10-CM | POA: Diagnosis not present

## 2023-02-24 DIAGNOSIS — D075 Carcinoma in situ of prostate: Secondary | ICD-10-CM | POA: Diagnosis not present

## 2023-02-24 DIAGNOSIS — R972 Elevated prostate specific antigen [PSA]: Secondary | ICD-10-CM | POA: Diagnosis not present

## 2023-03-01 DIAGNOSIS — Z23 Encounter for immunization: Secondary | ICD-10-CM | POA: Diagnosis not present

## 2023-03-08 DIAGNOSIS — Z23 Encounter for immunization: Secondary | ICD-10-CM | POA: Diagnosis not present

## 2023-03-08 DIAGNOSIS — C61 Malignant neoplasm of prostate: Secondary | ICD-10-CM | POA: Diagnosis not present

## 2023-03-13 DIAGNOSIS — C61 Malignant neoplasm of prostate: Secondary | ICD-10-CM | POA: Insufficient documentation

## 2023-03-13 NOTE — Progress Notes (Signed)
Radiation Oncology         ([phone]) [phone] ________________________________  Initial Outpatient Consultation  Name: Devin Rosales MRN: [id number]  Date: 03/14/2023  DOB: [birth year]  NF:[id number], Jerelyn Scott, MD  [name]*   REFERRING PHYSICIAN: [name]*  DIAGNOSIS: 77 y.o. gentleman with Stage T1c adenocarcinoma of the prostate with Gleason score of 3+3, and PSA of 5.2.    ICD-10-CM   1. Malignant neoplasm of prostate (HCC)  C61       HISTORY OF PRESENT ILLNESS: Devin Rosales is a 77 y.o. male with a diagnosis of prostate cancer. He has a history of elevated, fluctuating PSA since 2022, on routine labs by his primary care physician, Dr. Donette Larry.  He is an avid cyclist and skier whose PSA had been fluctuating between 4.2 and 4.8 but more recently, increased to 5.2 on labs in June 2024.  He also had some associated LUTS that have been manageable on Flomax daily.  Accordingly, he was referred for evaluation in urology by Dr. Liliane Shi on 12/23/22. He underwent prostate MRI on 01/17/23 showing a small PI-RADS 4 lesion of the left posterolateral peripheral zone at the apex. The patient proceeded to MRI fusion biopsy of the prostate on 02/24/23.  The prostate volume measured 47.5 cc.  Out of 16 core biopsies, 9 were positive.  The maximum Gleason score was 3+3, and this was seen in the right apex lateral, left apex, left apex lateral, left mid lateral, left base lateral (small focus), and all four cores from the MRI ROI (with perineural invasion).  The patient reviewed the biopsy results with his urologist and he has kindly been referred today for discussion of potential radiation treatment options.   PREVIOUS RADIATION THERAPY: No  PAST MEDICAL HISTORY:  Past Medical History:  Diagnosis Date   BPH (benign prostatic hyperplasia)    CAD (coronary artery disease)    Esophageal spasm    Fast heart beat    GERD (gastroesophageal reflux disease)    Hx of insomnia    Hx of vertigo     BENIGN POSITIONAL   Hypercholesterolemia    Hyperlipidemia    Irritable bowel syndrome    Oral herpes simplex infection    Prostatitis 1998   Seasonal allergic rhinitis    Spermatocele    RIGHT      PAST SURGICAL HISTORY: Past Surgical History:  Procedure Laterality Date   APPENDECTOMY     ATRIAL FIBRILLATION ABLATION N/A 01/04/2018   Procedure: ATRIAL FIBRILLATION ABLATION;  Surgeon: Regan Lemming, MD;  Location: [facility];  Service: Cardiovascular;  Laterality: N/A;   HERNIA REPAIR     TONSILLECTOMY     TREATMENT FISTULA ANAL      FAMILY HISTORY:  Family History  Problem Relation Age of Onset   Breast cancer Mother    Prostate cancer Brother    Hypertension Brother     SOCIAL HISTORY: He enjoys a 1.5 hr group bike ride on Wednesdays and otherwise, enjoys [employer] and [employer] training throughout the week. He is married and lives in [place] with his wife, Truddie Hidden and enjoys skiing at least 1 week per month through the winter. Social History   Socioeconomic History   Marital status: Married    Spouse name: Not on file   Number of children: Not on file   Years of education: Not on file   Highest education level: Not on file  Occupational History   Not on file  Tobacco Use  Smoking status: Never   Smokeless tobacco: Never  Vaping Use   Vaping status: Never Used  Substance and Sexual Activity   Alcohol use: Yes    Comment: 3 beers,weekly   Drug use: No   Sexual activity: Yes    Comment: MARRIED  Other Topics Concern   Not on file  Social History Narrative   Not on file   Social Determinants of Health   Financial Resource Strain: Not on file  Food Insecurity: No Food Insecurity (03/14/2023)   Hunger Vital Sign    Worried About Running Out of Food in the Last Year: Never true    Ran Out of Food in the Last Year: Never true  Transportation Needs: No Transportation Needs (03/14/2023)   PRAPARE - Scientist, research (physical sciences) (Medical): No    Lack of Transportation (Non-Medical): No  Physical Activity: Not on file  Stress: Not on file  Social Connections: Not on file  Intimate Partner Violence: Not At Risk (03/14/2023)   Humiliation, Afraid, Rape, and Kick questionnaire    Fear of Current or Ex-Partner: No    Emotionally Abused: No    Physically Abused: No    Sexually Abused: No    ALLERGIES: Crestor [rosuvastatin calcium], Ezetimibe-simvastatin, Lipitor [atorvastatin], Other, and Zocor [simvastatin]  MEDICATIONS:  Current Outpatient Medications  Medication Sig Dispense Refill   ezetimibe (ZETIA) 10 MG tablet Take 10 mg by mouth daily.     Multiple Vitamin (MULTIVITAMIN WITH MINERALS) TABS tablet Take 1 tablet by mouth every morning.      pravastatin (PRAVACHOL) 40 MG tablet Take 40 mg by mouth every evening.      tamsulosin (FLOMAX) 0.4 MG CAPS capsule Take 0.4 mg by mouth daily as needed (slow urine flow).   0   aspirin EC 81 MG tablet Take 81 mg by mouth daily. Swallow whole.     calcium carbonate (TUMS - DOSED IN MG ELEMENTAL CALCIUM) 500 MG chewable tablet Chew 1 tablet by mouth daily as needed for indigestion or heartburn.     Coenzyme Q10 (COQ10) 100 MG CAPS Take 100 mg by mouth every evening. (Patient not taking: Reported on 03/14/2023)     fluticasone (FLONASE) 50 MCG/ACT nasal spray Place 1-2 sprays into both nostrils See admin instructions. Use 2 sprays in the morning in each nare and 1 spray in each nare at night as needed for allergys     Ketotifen Fumarate (ALLERGY EYE DROPS OP) Place 1 drop into both eyes daily as needed (allergies).     pseudoephedrine (SUDAFED) 30 MG tablet Take 30 mg by mouth every 4 (four) hours as needed (sinus headaches).     valACYclovir (VALTREX) 1000 MG tablet Take 2,000 mg by mouth 2 (two) times daily as needed (fever blisters).     No current facility-administered medications for this encounter.    REVIEW OF SYSTEMS:  On review of systems, the  patient reports that he is doing well overall. He denies any chest pain, shortness of breath, cough, fevers, chills, night sweats, unintended weight changes. He denies any bowel disturbances, and denies abdominal pain, nausea or vomiting. He denies any new musculoskeletal or joint aches or pains. His IPSS was 11, indicating moderate urinary symptoms that are manageable on Flomax daily. His SHIM was 20, indicating he has mild erectile dysfunction. A complete review of systems is obtained and is otherwise negative.    PHYSICAL EXAM:  Wt Readings from Last 3 Encounters:  03/14/23 142 lb 6.4  oz (64.6 kg)  09/23/22 140 lb 9.6 oz (63.8 kg)  05/05/21 142 lb (64.4 kg)   Temp Readings from Last 3 Encounters:  03/14/23 (!) 97.3 F (36.3 C)  01/05/18 98.1 F (36.7 C) (Oral)  11/30/16 97.7 F (36.5 C) (Oral)   BP Readings from Last 3 Encounters:  03/14/23 122/65  09/23/22 122/70  05/05/21 108/76   Pulse Readings from Last 3 Encounters:  03/14/23 82  09/23/22 69  05/05/21 78   Pain Assessment Pain Score: 0-No pain/10  In general this is a well appearing Caucasian male in no acute distress. He's alert and oriented x4 and appropriate throughout the examination. Cardiopulmonary assessment is negative for acute distress, and he exhibits normal effort.     KPS = 100  100 - Normal; no complaints; no evidence of disease. 90   - Able to carry on normal activity; minor signs or symptoms of disease. 80   - Normal activity with effort; some signs or symptoms of disease. 25   - Cares for self; unable to carry on normal activity or to do active work. 60   - Requires occasional assistance, but is able to care for most of his personal needs. 50   - Requires considerable assistance and frequent medical care. 40   - Disabled; requires special care and assistance. 30   - Severely disabled; hospital admission is indicated although death not imminent. 20   - Very sick; hospital admission necessary; active  supportive treatment necessary. 10   - Moribund; fatal processes progressing rapidly. 0     - Dead  Karnofsky DA, Abelmann WH, Craver LS and Burchenal [facility] [phone]) The use of the nitrogen mustards in the palliative treatment of carcinoma: with particular reference to bronchogenic carcinoma Cancer 1 634-56  LABORATORY DATA:  Lab Results  Component Value Date   WBC 5.9 01/01/2018   HGB 14.2 01/01/2018   HCT 40.8 01/01/2018   MCV 91 01/01/2018   PLT 232 01/01/2018   Lab Results  Component Value Date   NA 138 01/01/2018   K 4.3 01/01/2018   CL 98 01/01/2018   CO2 25 01/01/2018   Lab Results  Component Value Date   ALT 15 07/07/2013   AST 18 07/07/2013   ALKPHOS 60 07/07/2013   BILITOT 0.2 (L) 07/07/2013     RADIOGRAPHY: No results found.    IMPRESSION/PLAN: 1. 77 y.o. gentleman with Stage T1c adenocarcinoma of the prostate with Gleason Score of 3+3, and PSA of 5.2. We discussed the patient's workup and outlined the nature of prostate cancer in this setting. The patient's T stage, Gleason's score, and PSA put him into the favorable low risk group. Accordingly, he is eligible for a variety of potential treatment options including active surveillance, brachytherapy, 5.5 weeks of external radiation, or prostatectomy. We discussed the available radiation techniques, and focused on the details and logistics of delivery. We discussed and outlined the risks, benefits, short and long-term effects associated with radiotherapy and compared and contrasted these with prostatectomy. We discussed the role of SpaceOAR gel in reducing the rectal toxicity associated with radiotherapy. He appears to have a good understanding of his disease and our treatment recommendations which are of curative intent.  He was encouraged to ask questions that were answered to his stated satisfaction.  At the conclusion of our conversation, the patient is leaning towards proceeding with active surveillance.  He does have a  decipher test pending, which may help to solidify his decision.  We will  share our discussion with Dr. Liliane Shi.  He has our contact information and will let us know if he ultimately elects to proceed with treatment in the form of brachytherapy or daily external beam radiation and we will proceed with treatment planning accordingly at that time.  Otherwise, we enjoyed meeting him today and will look forward to following along in his care via correspondence.   We personally spent 70 minutes in this encounter including chart review, reviewing radiological studies, meeting face-to-face with the patient, entering orders and completing documentation.    [name], PA-C    Margaretmary Dys, MD  [facility]  Radiation Oncology Direct Dial: [phone]  Fax: [phone] [email or web address]  Skype  LinkedIn   This document serves as a record of services personally performed by Margaretmary Dys, MD and Marcello Fennel, PA-C. It was created on their behalf by Mickie Bail, a trained medical scribe. The creation of this record is based on the scribe's personal observations and the provider's statements to them. This document has been checked and approved by the attending provider.

## 2023-03-13 NOTE — Progress Notes (Signed)
GU Location of Tumor / Histology:  Prostate Ca  If Prostate Cancer, Gleason Score is (3 + 3) and PSA is (5.2 on 09/2022)  Biopsies     01/17/2023 Dr. Cristal Deer Liliane Shi MR Prostate with/without Contrast CLINICAL DATA: Elevated PSA level. R97.20   IMPRESSION: 1. Small PI-RADS category 4 lesion of the left posterolateral peripheral zone at the apex. Targeting data sent to [facility]. 2. Benign prostatic hypertrophy. 3. Sigmoid colon diverticulosis.   Past/Anticipated interventions by urology, if any: NA  Past/Anticipated interventions by medical oncology, if any: NA  Weight changes, if any: {:18581}  IPSS: SHIM:  Bowel/Bladder complaints, if any: {:18581}   Nausea/Vomiting, if any: {:18581}  Pain issues, if any:  {:18581}  SAFETY ISSUES: Prior radiation? {:18581} Pacemaker/ICD? {:18581} Possible current pregnancy? Male Is the patient on methotrexate? No  Current Complaints / other details:

## 2023-03-14 ENCOUNTER — Ambulatory Visit
Admission: RE | Admit: 2023-03-14 | Discharge: 2023-03-14 | Disposition: A | Discharge status: Home / Self Care | Payer: Medicare Other | Source: Ambulatory Visit | Attending: Radiation Oncology | Admitting: Radiation Oncology

## 2023-03-14 ENCOUNTER — Encounter: Payer: Self-pay | Admitting: Radiation Oncology

## 2023-03-14 VITALS — BP 122/65 | HR 82 | Temp 97.3°F | Resp 18 | Wt 142.4 lb

## 2023-03-14 DIAGNOSIS — I2510 Atherosclerotic heart disease of native coronary artery without angina pectoris: Secondary | ICD-10-CM | POA: Insufficient documentation

## 2023-03-14 DIAGNOSIS — K219 Gastro-esophageal reflux disease without esophagitis: Secondary | ICD-10-CM | POA: Insufficient documentation

## 2023-03-14 DIAGNOSIS — Z7982 Long term (current) use of aspirin: Secondary | ICD-10-CM | POA: Insufficient documentation

## 2023-03-14 DIAGNOSIS — Z803 Family history of malignant neoplasm of breast: Secondary | ICD-10-CM | POA: Diagnosis not present

## 2023-03-14 DIAGNOSIS — C61 Malignant neoplasm of prostate: Secondary | ICD-10-CM | POA: Diagnosis not present

## 2023-03-14 DIAGNOSIS — K589 Irritable bowel syndrome without diarrhea: Secondary | ICD-10-CM | POA: Insufficient documentation

## 2023-03-14 DIAGNOSIS — Z79899 Other long term (current) drug therapy: Secondary | ICD-10-CM | POA: Insufficient documentation

## 2023-03-14 DIAGNOSIS — E785 Hyperlipidemia, unspecified: Secondary | ICD-10-CM | POA: Insufficient documentation

## 2023-03-14 DIAGNOSIS — E7800 Pure hypercholesterolemia, unspecified: Secondary | ICD-10-CM | POA: Insufficient documentation

## 2023-03-14 DIAGNOSIS — Z191 Hormone sensitive malignancy status: Secondary | ICD-10-CM | POA: Diagnosis not present

## 2023-03-14 DIAGNOSIS — Z8042 Family history of malignant neoplasm of prostate: Secondary | ICD-10-CM | POA: Diagnosis not present

## 2023-03-14 NOTE — Progress Notes (Signed)
Introduced myself to the patient as the prostate nurse navigator.  No barriers to care identified at this time.  He is here to discuss his radiation treatment options.  I gave him my business card and asked him to call me with questions or concerns.  Verbalized understanding.  ?

## 2023-03-19 DIAGNOSIS — C61 Malignant neoplasm of prostate: Secondary | ICD-10-CM | POA: Diagnosis not present

## 2023-04-13 NOTE — Progress Notes (Signed)
Patient was a consult on 11/19 for his stage T1c adenocarcinoma of the prostate with Gleason score of 3+3, and PSA of 5.2 and originally decided to proceed with active surveillance.    Patient had decipher testing results indicating intermediate risk for higher grade and after speaking with Dr. Liliane Shi patient would now like to pursue brachytherapy first available.  Per Dr. Kathrynn Running he would recommend a PSA prior to brachy, RN will assist in coordinating for that appointment.   MD's notified, plan of care in progress.

## 2023-04-14 ENCOUNTER — Telehealth: Payer: Self-pay | Admitting: *Deleted

## 2023-04-14 NOTE — Telephone Encounter (Signed)
CALLED PATIENT TO ASK QUESTIONS, SPOKE WITH PATIENT 

## 2023-04-28 ENCOUNTER — Telehealth: Payer: Self-pay | Admitting: *Deleted

## 2023-04-28 NOTE — Telephone Encounter (Signed)
 Called patient to update, spoke with patient.

## 2023-05-02 NOTE — Progress Notes (Signed)
 Brachytherapy date still pending at this time.  Will continue to follow and ensure PSA is repeated prior to brachytherapy.

## 2023-05-29 DIAGNOSIS — C61 Malignant neoplasm of prostate: Secondary | ICD-10-CM | POA: Diagnosis not present

## 2023-06-01 ENCOUNTER — Other Ambulatory Visit: Payer: Self-pay | Admitting: Urology

## 2023-06-01 ENCOUNTER — Telehealth: Payer: Self-pay | Admitting: *Deleted

## 2023-06-01 NOTE — Telephone Encounter (Signed)
Called patient to inform of pre-seed appts. and implant date, spoke with patient and he is aware of these appts.

## 2023-06-08 ENCOUNTER — Ambulatory Visit: Payer: Medicare Other | Admitting: Radiation Oncology

## 2023-06-08 ENCOUNTER — Ambulatory Visit: Payer: Self-pay | Admitting: Urology

## 2023-06-13 ENCOUNTER — Encounter: Payer: Self-pay | Admitting: Urology

## 2023-06-13 ENCOUNTER — Telehealth: Payer: Self-pay | Admitting: *Deleted

## 2023-06-13 NOTE — Telephone Encounter (Signed)
CALLED PATIENT TO INFORM OF PRE-SEED APPTS. BEING MOVED ON 06-15-23- ARRIVAL TIME- 9:45 AM ON 06-15-23 @ [facility], SPOKE WITH PATIENT AND HE IS AWARE OF THE CHANGE AND GOOD WITH THE CHANGE

## 2023-06-13 NOTE — Progress Notes (Signed)
Pre-seed nursing interview for a diagnosis of Malignant neoplasm of prostate (HCC) Stage I (cT1c, cN0, cM0, PSA: 5.2, Grade Group: 1).  Patient identity verified x2.   Patient reports doing well. No issues conveyed at this time.  Meaningful use complete.  Urinary Management medication(s)- Tamsulosin Urology appointment date- 07/04/23, with Dr. Sande Brothers at [facility]  Ht 5\' 5"  (1.651 m)   Wt 140 lb (63.5 kg)   BMI 23.30 kg/m   This concludes the interaction.  Ruel Favors, LPN

## 2023-06-14 ENCOUNTER — Telehealth: Payer: Self-pay | Admitting: *Deleted

## 2023-06-14 NOTE — Progress Notes (Signed)
Radiation Oncology         (336) [phone] ________________________________  Outpatient Follow up- Pre-seed visit  Name: Devin Rosales MRN: [id number]  Date: 06/15/2023  DOB: Aug 31, 1945  [id number], Jerelyn Scott, MD  [name]*   REFERRING PHYSICIAN: [name]*  DIAGNOSIS: 78 y.o. gentleman with Stage T1c adenocarcinoma of the prostate with Gleason score of 3+3, and PSA of 5.2.     ICD-10-CM   1. Malignant neoplasm of prostate (HCC)  C61       HISTORY OF PRESENT ILLNESS: Devin Rosales is a 78 y.o. male with a diagnosis of prostate cancer. He has a history of elevated, fluctuating PSA since 2022, on routine labs by his primary care physician, Dr. Donette Larry.  He is an avid cyclist and skier whose PSA had been fluctuating between 4.2 and 4.8 but more recently, increased to 5.2 on labs in June 2024.  He also had some associated LUTS that have been manageable on Flomax daily.  Accordingly, he was referred for evaluation in urology by Dr. Liliane Shi on 12/23/22. He underwent prostate MRI on 01/17/23 showing a small PI-RADS 4 lesion of the left posterolateral peripheral zone at the apex. The patient proceeded to MRI fusion biopsy of the prostate on 02/24/23.  The prostate volume measured 47.5 cc.  Out of 16 core biopsies, 9 were positive.  The maximum Gleason score was 3+3, and this was seen in the right apex lateral, left apex, left apex lateral, left mid lateral, left base lateral (small focus), and all four cores from the MRI ROI (with perineural invasion).   The patient reviewed the biopsy results with his urologist and was kindly referred to [place] for discussion of potential radiation treatment options. We initially met the patient on 03/14/23 and he was most interested in proceeding with active surveillance but has since decided to move forward with brachytherapy and SpaceOAR gel placement for treatment of his disease. He is here today for his pre-procedure imaging for planning and to answer  any additional questions he may have about this treatment.   PREVIOUS RADIATION THERAPY: No  PAST MEDICAL HISTORY:  Past Medical History:  Diagnosis Date   BPH (benign prostatic hyperplasia)    CAD (coronary artery disease)    Esophageal spasm    Fast heart beat    GERD (gastroesophageal reflux disease)    Hx of insomnia    Hx of vertigo    BENIGN POSITIONAL   Hypercholesterolemia    Hyperlipidemia    Irritable bowel syndrome    Oral herpes simplex infection    Prostatitis 1998   Seasonal allergic rhinitis    Spermatocele    RIGHT      PAST SURGICAL HISTORY: Past Surgical History:  Procedure Laterality Date   APPENDECTOMY     ATRIAL FIBRILLATION ABLATION N/A 01/04/2018   Procedure: ATRIAL FIBRILLATION ABLATION;  Surgeon: Regan Lemming, MD;  Location: [facility] INVASIVE [facility];  Service: Cardiovascular;  Laterality: N/A;   HERNIA REPAIR     TONSILLECTOMY     TREATMENT FISTULA ANAL      FAMILY HISTORY:  Family History  Problem Relation Age of Onset   Breast cancer Mother    Prostate cancer Brother    Hypertension Brother     SOCIAL HISTORY:  Social History   Socioeconomic History   Marital status: Married    Spouse name: Not on file   Number of children: Not on file   Years of education: Not on file   Highest education  level: Not on file  Occupational History   Not on file  Tobacco Use   Smoking status: Never   Smokeless tobacco: Never  Vaping Use   Vaping status: Never Used  Substance and Sexual Activity   Alcohol use: Yes    Comment: 3 beers,weekly   Drug use: No   Sexual activity: Yes    Comment: MARRIED  Other Topics Concern   Not on file  Social History Narrative   Not on file   Social Drivers of Health   Financial Resource Strain: Not on file  Food Insecurity: No Food Insecurity (06/13/2023)   Hunger Vital Sign    Worried About Running Out of Food in the Last Year: Never true    Ran Out of Food in the Last Year: Never true   Transportation Needs: No Transportation Needs (06/13/2023)   PRAPARE - Administrator, Civil Service (Medical): No    Lack of Transportation (Non-Medical): No  Physical Activity: Not on file  Stress: Not on file  Social Connections: Not on file  Intimate Partner Violence: Not At Risk (06/13/2023)   Humiliation, Afraid, Rape, and Kick questionnaire    Fear of Current or Ex-Partner: No    Emotionally Abused: No    Physically Abused: No    Sexually Abused: No    ALLERGIES: Crestor [rosuvastatin calcium], Ezetimibe-simvastatin, Lipitor [atorvastatin], Other, and Zocor [simvastatin]  MEDICATIONS:  Current Outpatient Medications  Medication Sig Dispense Refill   aspirin EC 81 MG tablet Take 81 mg by mouth every evening. Swallow whole.     calcium carbonate (TUMS - DOSED IN MG ELEMENTAL CALCIUM) 500 MG chewable tablet Chew 1 tablet by mouth daily as needed for indigestion or heartburn.     ezetimibe (ZETIA) 10 MG tablet Take 10 mg by mouth daily.     Multiple Vitamin (MULTIVITAMIN WITH MINERALS) TABS tablet Take 1 tablet by mouth every morning.      Povidone, PF, (IVIZIA DRY EYES) 0.5 % SOLN Place 1-2 drops into both eyes 3 (three) times daily as needed (dry/irritated eyes.).     pravastatin (PRAVACHOL) 40 MG tablet Take 40 mg by mouth every evening.      pseudoephedrine (SUDAFED) 30 MG tablet Take 30 mg by mouth every 4 (four) hours as needed (sinus headaches).     tamsulosin (FLOMAX) 0.4 MG CAPS capsule Take 0.4 mg by mouth every evening.  0   valACYclovir (VALTREX) 1000 MG tablet Take 2,000 mg by mouth 2 (two) times daily as needed (fever blisters).     No current facility-administered medications for this encounter.    REVIEW OF SYSTEMS:  On review of systems, the patient reports that he is doing well overall. He denies any chest pain, shortness of breath, cough, fevers, chills, night sweats, unintended weight changes. He denies any bowel disturbances, and denies abdominal  pain, nausea or vomiting. He denies any new musculoskeletal or joint aches or pains. His IPSS was 11, indicating moderate urinary symptoms that are manageable on Flomax daily. His SHIM was 20, indicating he has mild erectile dysfunction. A complete review of systems is obtained and is otherwise negative.     PHYSICAL EXAM:  Wt Readings from Last 3 Encounters:  06/13/23 140 lb (63.5 kg)  03/14/23 142 lb 6.4 oz (64.6 kg)  09/23/22 140 lb 9.6 oz (63.8 kg)   Temp Readings from Last 3 Encounters:  03/14/23 (!) 97.3 F (36.3 C)  01/05/18 98.1 F (36.7 C) (Oral)  11/30/16 97.7 F (  36.5 C) (Oral)   BP Readings from Last 3 Encounters:  03/14/23 122/65  09/23/22 122/70  05/05/21 108/76   Pulse Readings from Last 3 Encounters:  03/14/23 82  09/23/22 69  05/05/21 78   Pain Assessment Pain Score: 0-No pain/10  In general this is a well appearing Caucasian male in no acute distress. He's alert and oriented x4 and appropriate throughout the examination. Cardiopulmonary assessment is negative for acute distress, and he exhibits normal effort.     KPS = 100  100 - Normal; no complaints; no evidence of disease. 90   - Able to carry on normal activity; minor signs or symptoms of disease. 80   - Normal activity with effort; some signs or symptoms of disease. 57   - Cares for self; unable to carry on normal activity or to do active work. 60   - Requires occasional assistance, but is able to care for most of his personal needs. 50   - Requires considerable assistance and frequent medical care. 40   - Disabled; requires special care and assistance. 30   - Severely disabled; hospital admission is indicated although death not imminent. 20   - Very sick; hospital admission necessary; active supportive treatment necessary. 10   - Moribund; fatal processes progressing rapidly. 0     - Dead  Karnofsky DA, Abelmann WH, Craver LS and Burchenal [facility] [phone]) The use of the nitrogen mustards in the  palliative treatment of carcinoma: with particular reference to bronchogenic carcinoma Cancer 1 [phone]  LABORATORY DATA:  Lab Results  Component Value Date   WBC 5.9 01/01/2018   HGB 14.2 01/01/2018   HCT 40.8 01/01/2018   MCV 91 01/01/2018   PLT 232 01/01/2018   Lab Results  Component Value Date   NA 138 01/01/2018   K 4.3 01/01/2018   CL 98 01/01/2018   CO2 25 01/01/2018   Lab Results  Component Value Date   ALT 15 07/07/2013   AST 18 07/07/2013   ALKPHOS 60 07/07/2013   BILITOT 0.2 (L) 07/07/2013     RADIOGRAPHY: No results found.    IMPRESSION/PLAN: 1. 78 y.o. gentleman with Stage T1c adenocarcinoma of the prostate with Gleason score of 3+3, and PSA of 5.2.  The patient has elected to proceed with seed implant for treatment of his disease. We reviewed the risks, benefits, short and long-term effects associated with brachytherapy and discussed the role of SpaceOAR in reducing the rectal toxicity associated with radiotherapy.  He appears to have a good understanding of his disease and our treatment recommendations which are of curative intent.  He was encouraged to ask questions that were answered to his stated satisfaction. He has freely signed written consent to proceed today in the office and a copy of this document will be placed in his medical record. His procedure is tentatively scheduled for 07/04/23 in collaboration with Dr. Liliane Shi and we will see him back for his post-procedure visit approximately 3 weeks thereafter. We look forward to continuing to participate in his care. He knows that he is welcome to call with any questions or concerns at any time in the interim.  I personally spent 30 minutes in this encounter including chart review, reviewing radiological studies, meeting face-to-face with the patient, entering orders and completing documentation.    [name], MMS, PA-C [place]  [facility] Radiation Oncology Physician  Assistant Direct Dial: [phone]  Fax: [phone]

## 2023-06-14 NOTE — Telephone Encounter (Signed)
CALLED PATIENT TO REMIND OF PRE-SEED APPTS. FOR 06-15-23, SPOKE WITH PATIENT AND HE IS AWARE OF THESE APPTS.

## 2023-06-14 NOTE — Progress Notes (Signed)
  Radiation Oncology         [phone] ________________________________  Name: Devin Rosales MRN: [id number]  Date: 06/15/2023  DOB: 1946-01-09  SIMULATION AND TREATMENT PLANNING NOTE PUBIC ARCH STUDY  [id number], Jerelyn Scott, MD  [name]*  DIAGNOSIS: 78 y.o. gentleman with Stage T1c adenocarcinoma of the prostate with Gleason score of 3+3, and PSA of 5.2.   Oncology History  Malignant neoplasm of prostate (HCC)  02/24/2023 Cancer Staging   Staging form: Prostate, AJCC 8th Edition - Clinical stage from 02/24/2023: Stage I (cT1c, cN0, cM0, PSA: 5.2, Grade Group: 1) - Signed by Marcello Fennel, PA-C on 03/13/2023 Histopathologic type: Adenocarcinoma, NOS Stage prefix: Initial diagnosis Prostate specific antigen (PSA) range: Less than 10 Gleason primary pattern: 3 Gleason secondary pattern: 3 Gleason score: 6 Histologic grading system: 5 grade system Number of biopsy cores examined: 16 Number of biopsy cores positive: 9 Location of positive needle core biopsies: Both sides   03/13/2023 Initial Diagnosis   Malignant neoplasm of prostate (HCC)       ICD-10-CM   1. Malignant neoplasm of prostate (HCC)  C61       COMPLEX SIMULATION:  The patient presented today for evaluation for possible prostate seed implant. He was brought to the radiation planning suite and placed supine on the CT couch. A 3-dimensional image study set was obtained in upload to the planning computer. There, on each axial slice, I contoured the prostate gland. Then, using three-dimensional radiation planning tools I reconstructed the prostate in view of the structures from the transperineal needle pathway to assess for possible pubic arch interference. In doing so, I did not appreciate any pubic arch interference. Also, the patient's prostate volume was estimated based on the drawn structure. The volume was 47 cc.  Given the pubic arch appearance and prostate volume, patient remains a good candidate to  proceed with prostate seed implant. Today, he freely provided informed written consent to proceed.    PLAN: The patient will undergo prostate seed implant.   ________________________________  Artist Pais. Kathrynn Running, M.D.

## 2023-06-15 ENCOUNTER — Encounter (HOSPITAL_COMMUNITY): Payer: Self-pay

## 2023-06-15 ENCOUNTER — Ambulatory Visit
Admission: RE | Admit: 2023-06-15 | Discharge: 2023-06-15 | Disposition: A | Discharge status: Home / Self Care | Payer: Medicare Other | Source: Ambulatory Visit | Attending: Urology | Admitting: Urology

## 2023-06-15 ENCOUNTER — Ambulatory Visit
Admission: RE | Admit: 2023-06-15 | Discharge: 2023-06-15 | Disposition: A | Discharge status: Home / Self Care | Payer: Medicare Other | Source: Ambulatory Visit | Attending: Radiation Oncology | Admitting: Radiation Oncology

## 2023-06-15 ENCOUNTER — Encounter: Payer: Self-pay | Admitting: Urology

## 2023-06-15 ENCOUNTER — Encounter (HOSPITAL_COMMUNITY): Admission: RE | Admit: 2023-06-15 | Payer: Medicare Other | Source: Ambulatory Visit

## 2023-06-15 VITALS — Ht 65.0 in | Wt 140.0 lb

## 2023-06-15 DIAGNOSIS — Z191 Hormone sensitive malignancy status: Secondary | ICD-10-CM | POA: Diagnosis not present

## 2023-06-15 DIAGNOSIS — C61 Malignant neoplasm of prostate: Secondary | ICD-10-CM | POA: Insufficient documentation

## 2023-06-20 NOTE — Progress Notes (Signed)
 COVID Vaccine Completed: yes  Date of COVID positive in last 90 days:  PCP - [name], MD Cardiologist - Donato Schultz, MD Electrophysiologist- Loman Brooklyn, MD  Chest x-ray -  EKG - 09/23/22 Epic Stress Test - 08/25/16 Epic ECHO - 09/14/16 Epic Ablation- 01/04/18 Cardiac Cath -  Pacemaker/ICD device last checked: Spinal Cord Stimulator:  Bowel Prep -   Sleep Study -  CPAP -   Fasting Blood Sugar -  Checks Blood Sugar _____ times a day  Last dose of GLP1 agonist-  N/A GLP1 instructions:  Hold 7 days before surgery    Last dose of SGLT-2 inhibitors-  N/A SGLT-2 instructions:  Hold 3 days before surgery    Blood Thinner Instructions:  Last dose:   Time: Aspirin Instructions: ASA 81 Last Dose:  Activity level:  Can go up a flight of stairs and perform activities of daily living without stopping and without symptoms of chest pain or shortness of breath.  Able to exercise without symptoms  Unable to go up a flight of stairs without symptoms of     Anesthesia review: a fib, CAD,   Patient denies shortness of breath, fever, cough and chest pain at PAT appointment  Patient verbalized understanding of instructions that were given to them at the PAT appointment. Patient was also instructed that they will need to review over the PAT instructions again at home before surgery.

## 2023-06-20 NOTE — Patient Instructions (Signed)
 SURGICAL WAITING ROOM VISITATION  Patients having surgery or a procedure may have no more than 2 support people in the waiting area - these visitors may rotate.    Children under the age of 82 must have an adult with them who is not the patient.  Due to an increase in RSV and influenza rates and associated hospitalizations, children ages 90 and under may not visit patients in [facility] hospitals.  Visitors with respiratory illnesses are discouraged from visiting and should remain at home.  If the patient needs to stay at the hospital during part of their recovery, the visitor guidelines for inpatient rooms apply. Pre-op nurse will coordinate an appropriate time for 1 support person to accompany patient in pre-op.  This support person may not rotate.    Please refer to the [facility] website for the visitor guidelines for Inpatients (after your surgery is over and you are in a regular room).    Your procedure is scheduled on: 07/04/23   Report to [facility] Main Entrance    Report to admitting at 7:45 AM   Call this number if you have problems the morning of surgery [phone]   Do not eat food or drink liquids :After Midnight.          If you have questions, please contact your surgeon's office.   FOLLOW BOWEL PREP AND ANY ADDITIONAL PRE OP INSTRUCTIONS YOU RECEIVED FROM YOUR SURGEON'S OFFICE!!!     Oral Hygiene is also important to reduce your risk of infection.                                    Remember - BRUSH YOUR TEETH THE MORNING OF SURGERY WITH YOUR REGULAR TOOTHPASTE  DENTURES WILL BE REMOVED PRIOR TO SURGERY PLEASE DO NOT APPLY "Poly grip" OR ADHESIVES!!!   Stop all vitamins and herbal supplements 7 days before surgery.   Take these medicines the morning of surgery with A SIP OF WATER: Zetia, Eye drops             You may not have any metal on your body including jewelry, and body piercing             Do not wear lotions, powders, cologne, or  deodorant              Men may shave face and neck.   Do not bring valuables to the hospital. [facility] IS NOT             RESPONSIBLE   FOR VALUABLES.   Contacts, glasses, dentures or bridgework may not be worn into surgery.  DO NOT BRING YOUR HOME MEDICATIONS TO THE HOSPITAL. PHARMACY WILL DISPENSE MEDICATIONS LISTED ON YOUR MEDICATION LIST TO YOU DURING YOUR ADMISSION IN THE HOSPITAL!    Patients discharged on the day of surgery will not be allowed to drive home.  Someone NEEDS to stay with you for the first 24 hours after anesthesia.              Please read over the following fact sheets you were given: IF YOU HAVE QUESTIONS ABOUT YOUR PRE-OP INSTRUCTIONS PLEASE CALL [phone] Contras    If you received a COVID test during your pre-op visit  it is requested that you wear a mask when out in public, stay away from anyone that may not be feeling well and notify your surgeon if you develop  symptoms. If you test positive for Covid or have been in contact with anyone that has tested positive in the last 10 days please notify you surgeon.    [place] - Preparing for Surgery Before surgery, you can play an important role.  Because skin is not sterile, your skin needs to be as free of germs as possible.  You can reduce the number of germs on your skin by washing with CHG (chlorahexidine gluconate) soap before surgery.  CHG is an antiseptic cleaner which kills germs and bonds with the skin to continue killing germs even after washing. Please DO NOT use if you have an allergy to CHG or antibacterial soaps.  If your skin becomes reddened/irritated stop using the CHG and inform your nurse when you arrive at Short Stay. Do not shave (including legs and underarms) for at least 48 hours prior to the first CHG shower.  You may shave your face/neck.  Please follow these instructions carefully:  1.  Shower with CHG Soap the night before surgery and the  morning of surgery.  2.  If you choose to  wash your hair, wash your hair first as usual with your normal  shampoo.  3.  After you shampoo, rinse your hair and body thoroughly to remove the shampoo.                             4.  Use CHG as you would any other liquid soap.  You can apply chg directly to the skin and wash.  Gently with a scrungie or clean washcloth.  5.  Apply the CHG Soap to your body ONLY FROM THE NECK DOWN.   Do   not use on face/ open                           Wound or open sores. Avoid contact with eyes, ears mouth and   genitals (private parts).                       Wash face,  Genitals (private parts) with your normal soap.             6.  Wash thoroughly, paying special attention to the area where your    surgery  will be performed.  7.  Thoroughly rinse your body with warm water from the neck down.  8.  DO NOT shower/wash with your normal soap after using and rinsing off the CHG Soap.                9.  Pat yourself dry with a clean towel.            10.  Wear clean pajamas.            11.  Place clean sheets on your bed the night of your first shower and do not  sleep with pets. Day of Surgery : Do not apply any lotions/deodorants the morning of surgery.  Please wear clean clothes to the hospital/surgery center.  FAILURE TO FOLLOW THESE INSTRUCTIONS MAY RESULT IN THE CANCELLATION OF YOUR SURGERY  PATIENT SIGNATURE_________________________________  NURSE SIGNATURE__________________________________  ________________________________________________________________________

## 2023-06-21 ENCOUNTER — Other Ambulatory Visit: Payer: Self-pay

## 2023-06-21 ENCOUNTER — Encounter (HOSPITAL_COMMUNITY)
Admission: RE | Admit: 2023-06-21 | Discharge: 2023-06-21 | Disposition: A | Discharge status: Home / Self Care | Payer: Medicare Other | Source: Ambulatory Visit | Attending: Urology | Admitting: Urology

## 2023-06-21 ENCOUNTER — Encounter (HOSPITAL_COMMUNITY): Payer: Self-pay

## 2023-06-21 VITALS — BP 105/78 | HR 75 | Temp 98.2°F | Resp 16 | Ht 65.0 in | Wt 140.0 lb

## 2023-06-21 DIAGNOSIS — I480 Paroxysmal atrial fibrillation: Secondary | ICD-10-CM | POA: Insufficient documentation

## 2023-06-21 DIAGNOSIS — Z01812 Encounter for preprocedural laboratory examination: Secondary | ICD-10-CM | POA: Diagnosis not present

## 2023-06-21 DIAGNOSIS — Z01818 Encounter for other preprocedural examination: Secondary | ICD-10-CM | POA: Diagnosis present

## 2023-06-21 HISTORY — DX: Malignant (primary) neoplasm, unspecified: C80.1

## 2023-06-21 LAB — BASIC METABOLIC PANEL
Anion gap: 8 (ref 5–15)
BUN: 16 mg/dL (ref 8–23)
CO2: 27 mmol/L (ref 22–32)
Calcium: 9.4 mg/dL (ref 8.9–10.3)
Chloride: 102 mmol/L (ref 98–111)
Creatinine, Ser: 0.77 mg/dL (ref 0.61–1.24)
GFR, Estimated: >60 mL/min (ref >60)
Glucose, Bld: 98 mg/dL (ref 70–99)
Potassium: 4.4 mmol/L (ref 3.5–5.1)
Sodium: 137 mmol/L (ref 135–145)

## 2023-06-21 LAB — CBC
HCT: 44.6 % (ref 39.0–52.0)
Hemoglobin: 14.6 g/dL (ref 13.0–17.0)
MCH: 30.5 pg (ref 26.0–34.0)
MCHC: 32.7 g/dL (ref 30.0–36.0)
MCV: 93.3 fL (ref 80.0–100.0)
Platelets: 249 10*3/uL (ref 150–400)
RBC: 4.78 MIL/uL (ref 4.22–5.81)
RDW: 13 % (ref 11.5–15.5)
WBC: 5.5 10*3/uL (ref 4.0–10.5)
nRBC: 0 % (ref 0.0–0.2)

## 2023-06-27 ENCOUNTER — Encounter (HOSPITAL_COMMUNITY): Payer: Self-pay

## 2023-06-27 NOTE — Progress Notes (Signed)
 Case: [id number] Date/Time: 07/04/23 0945   Procedures:      INSERTION, RADIOACTIVE SEED - 90 MINUTE CASE     INSERTION, BRACHYTHERAPY DEVICE, PROSTATE   Anesthesia type: General   Pre-op diagnosis: PROSTATE CANCER   Location: [facility] PROCEDURE [facility] / [facility]   Surgeons: Rene Paci, MD       DISCUSSION: Devin Rosales is a 78 yo male who presents to PAT prior to surgery above. PMH of CAD, hx of A.fib s/p ablation (2019), GERD, esophageal spasm, prostate cancer.  Patient with hx of A.fib discovered on heart monitor due to palpitations. He underwent an ablation in 2019. He had a Cardiac CT as part of w/u prior to ablation and non-obstructive CAD was noted which is treated medically. Last seen by Cardiology on 09/23/22. He is off anticoagulation for A.fib since he has remained in SR after ablation. Advised f/u in 1 year.  VS: BP 105/78   Pulse 75   Temp 36.8 C (Oral)   Resp 16   Ht 5\' 5"  (1.651 m)   Wt 63.5 kg   SpO2 98%   BMI 23.30 kg/m   PROVIDERS: PCP - [name], MD Cardiologist - Donato Schultz, MD Electrophysiologist- Loman Brooklyn, MD  LABS: Labs reviewed: Acceptable for surgery. (all labs ordered are listed, but only abnormal results are displayed)  Labs Reviewed  BASIC METABOLIC PANEL  CBC     IMAGES:   EKG 09/23/22:  NSR rate 69   CV:  CT Cardiac 01/02/2018:  IMPRESSION: 1. There is normal pulmonary vein drainage into the left atrium.   2. The left atrial appendage is very large with one major lobe and no thrombus in the left atrial appendage.   3. The esophagus runs to the left from the left atrial midline and is in the proximity to the ostia of the LUPV and LLPV.   4. Coronary Arteries: Normal coronary origin. Right dominance. The study was performed without use of NTG and insufficient for plaque evaluation. However, calcium score is 337, that represents 62 percentile for age/sex.  Echo  09/14/2016: --------------------------------- Study Conclusions  - Left ventricle: The cavity size was normal. Systolic function was   vigorous. The estimated ejection fraction was in the range of 65%   to 70%. Wall motion was normal; there were no regional wall   motion abnormalities. Doppler parameters are consistent with   abnormal left ventricular relaxation (grade 1 diastolic   dysfunction). - Aortic valve: There was mild regurgitation. - Aorta: Aortic root dimension: 39 mm (ED). - Ascending aorta: The ascending aorta was mildly dilated. - Mitral valve: There was mild regurgitation.  Stress test 08/25/2016:  Blood pressure demonstrated a normal response to exercise. There was no ST segment deviation noted during stress. Clinically and electrically negative for ischemia Excellent exercise tolerance.  Past Medical History:  Diagnosis Date   BPH (benign prostatic hyperplasia)    CAD (coronary artery disease)    Cancer (HCC)    prostate   Esophageal spasm    Fast heart beat    GERD (gastroesophageal reflux disease)    Hx of insomnia    Hx of vertigo    BENIGN POSITIONAL   Hypercholesterolemia    Hyperlipidemia    Irritable bowel syndrome    Oral herpes simplex infection    Prostatitis 1998   Seasonal allergic rhinitis    Spermatocele    RIGHT    Past Surgical History:  Procedure Laterality Date   APPENDECTOMY  ATRIAL FIBRILLATION ABLATION N/A 01/04/2018   Procedure: ATRIAL FIBRILLATION ABLATION;  Surgeon: Regan Lemming, MD;  Location: [facility] INVASIVE CV LAB;  Service: Cardiovascular;  Laterality: N/A;   HERNIA REPAIR     TONSILLECTOMY     TREATMENT FISTULA ANAL      MEDICATIONS:  aspirin EC 81 MG tablet   calcium carbonate (TUMS - DOSED IN MG ELEMENTAL CALCIUM) 500 MG chewable tablet   ezetimibe (ZETIA) 10 MG tablet   Multiple Vitamin (MULTIVITAMIN WITH MINERALS) TABS tablet   Povidone, PF, (IVIZIA DRY EYES) 0.5 % SOLN   pravastatin (PRAVACHOL) 40 MG  tablet   pseudoephedrine (SUDAFED) 30 MG tablet   tamsulosin (FLOMAX) 0.4 MG CAPS capsule   valACYclovir (VALTREX) 1000 MG tablet   No current facility-administered medications for this encounter.   Marcille Blanco [facility] Surgical Short Stay/Anesthesiology [facility] Phone [phone] 06/27/2023 8:33 AM

## 2023-06-27 NOTE — Anesthesia Preprocedure Evaluation (Addendum)
 Anesthesia Evaluation  Patient identified by MRN, date of birth, ID band Patient awake    Reviewed: Allergy & Precautions, NPO status , Patient's Chart, lab work & pertinent test results  Airway Mallampati: II  TM Distance: >3 FB Neck ROM: Full    Dental no notable dental hx. (+) Teeth Intact, Dental Advisory Given   Pulmonary neg pulmonary ROS   Pulmonary exam normal breath sounds clear to auscultation       Cardiovascular + CAD  negative cardio ROS Normal cardiovascular exam+ dysrhythmias Atrial Fibrillation  Rhythm:Regular Rate:Normal     Neuro/Psych negative neurological ROS  negative psych ROS   GI/Hepatic ,GERD  ,,  Endo/Other    Renal/GU      Musculoskeletal   Abdominal   Peds negative pediatric ROS (+)  Hematology   Anesthesia Other Findings   Reproductive/Obstetrics                              Lab Results  Component Value Date   WBC 5.5 06/21/2023   HGB 14.6 06/21/2023   HCT 44.6 06/21/2023   MCV 93.3 06/21/2023   PLT 249 06/21/2023    Anesthesia Physical Anesthesia Plan  ASA: 3  Anesthesia Plan: General   Post-op Pain Management: Ofirmev IV (intra-op)*   Induction: Intravenous  PONV Risk Score and Plan: 2 and Treatment may vary due to age or medical condition, Ondansetron and Dexamethasone  Airway Management Planned: Oral ETT and LMA  Additional Equipment: None  Intra-op Plan:   Post-operative Plan: Extubation in OR  Informed Consent: I have reviewed the patients History and Physical, chart, labs and discussed the procedure including the risks, benefits and alternatives for the proposed anesthesia with the patient or authorized representative who has indicated his/her understanding and acceptance.     Dental advisory given  Plan Discussed with: CRNA and Anesthesiologist  Anesthesia Plan Comments: (See PAT note from 2/26 by Sherlie Ban PA-C DISCUSSION:  Devin Rosales is a 78 yo male who presents to PAT prior to surgery above. PMH of CAD, hx of A.fib s/p ablation (2019), GERD, esophageal spasm, prostate cancer.   Patient with hx of A.fib discovered on heart monitor due to palpitations. He underwent an ablation in 2019. He had a Cardiac CT as part of w/u prior to ablation and non-obstructive CAD was noted which is treated medically. Last seen by Cardiology on 09/23/22. He is off anticoagulation for A.fib since he has remained in SR after ablation. Advised f/u in 1 year.  Echo 09/14/2016: Left ventricle: The cavity size was normal. Systolic function was   vigorous. The estimated ejection fraction was in the range of 65%   to 70%. Wall motion was normal; there were no regional wall   motion abnormalities. Doppler parameters are consistent with   abnormal left ventricular relaxation (grade 1 diastolic   dysfunction). - Aortic valve: There was mild regurgitation. - Aorta: Aortic root dimension: 39 mm (ED). - Ascending aorta: The ascending aorta was mildly dilated. - Mitral valve: There was mild regurgitation.   Stress test 08/25/2016:   Blood pressure demonstrated a normal response to exercise. There was no ST segment deviation noted during stress. Clinically and electrically negative for ischemia Excellent exercise tolerance.  )         Anesthesia Quick Evaluation

## 2023-07-03 ENCOUNTER — Telehealth: Payer: Self-pay | Admitting: *Deleted

## 2023-07-03 NOTE — Progress Notes (Signed)
  Radiation Oncology         [phone] ________________________________  Name: Devin Rosales MRN: [id number]  Date: 07/04/2023  DOB: [birth year]       Prostate Seed Implant  WU:[id number], Jerelyn Scott, MD  No ref. provider found  DIAGNOSIS:  78 y.o. gentleman with Stage T1c adenocarcinoma of the prostate with Gleason score of 3+3, and PSA of 5.2.  Oncology History  Malignant neoplasm of prostate (HCC)  02/24/2023 Cancer Staging   Staging form: Prostate, AJCC 8th Edition - Clinical stage from 02/24/2023: Stage I (cT1c, cN0, cM0, PSA: 5.2, Grade Group: 1) - Signed by Marcello Fennel, PA-C on 03/13/2023 Histopathologic type: Adenocarcinoma, NOS Stage prefix: Initial diagnosis Prostate specific antigen (PSA) range: Less than 10 Gleason primary pattern: 3 Gleason secondary pattern: 3 Gleason score: 6 Histologic grading system: 5 grade system Number of biopsy cores examined: 16 Number of biopsy cores positive: 9 Location of positive needle core biopsies: Both sides   03/13/2023 Initial Diagnosis   Malignant neoplasm of prostate (HCC)     No diagnosis found.  PROCEDURE: Insertion of radioactive I-125 seeds into the prostate gland.  RADIATION DOSE: 145 Gy, definitive therapy.  TECHNIQUE: Devin Rosales was brought to the operating room with the urologist. He was placed in the dorsolithotomy position. He was catheterized and a rectal tube was inserted. The perineum was shaved, prepped and draped. The ultrasound probe was then introduced by me into the rectum to see the prostate gland.  TREATMENT DEVICE: I attached the needle grid to the ultrasound probe stand and anchor needles were placed.  3D PLANNING: The prostate was imaged in 3D using a sagittal sweep of the prostate probe. These images were transferred to the planning computer. There, the prostate, urethra and rectum were defined on each axial reconstructed image. Then, the software created an optimized 3D plan and a few seed positions were  adjusted. The quality of the plan was reviewed using [facility] information for the target and the following two organs at risk:  Urethra and Rectum.  Then the accepted plan was printed and handed off to the radiation therapist.  Under my supervision, the custom loading of the seeds and spacers was carried out using the quick loader.  These pre-loaded needles were then placed into the needle holder.Marland Kitchen  PROSTATE VOLUME STUDY:  Using transrectal ultrasound the volume of the prostate was verified to be 37.5 cc.  SPECIAL TREATMENT PROCEDURE/SUPERVISION AND HANDLING: The pre-loaded needles were then delivered by the urologist under sagittal guidance. A total of 17 needles were used to deposit 54 seeds in the prostate gland. The individual seed activity was 0.523 mCi.  SpaceOAR:  Yes  COMPLEX SIMULATION: At the end of the procedure, an anterior radiograph of the pelvis was obtained to document seed positioning and count. Cystoscopy was performed by the urologist to check the urethra and bladder.  MICRODOSIMETRY: At the end of the procedure, the patient was emitting 0.08 mR/hr at 1 meter. Accordingly, he was considered safe for hospital discharge.  PLAN: The patient will return to the radiation oncology clinic for post implant CT dosimetry in three weeks.   ________________________________  Artist Pais Kathrynn Running, M.D.

## 2023-07-03 NOTE — Telephone Encounter (Signed)
 Called patient to remind of procedure for 07-04-23, spoke with patient and he is aware of this procedure

## 2023-07-04 ENCOUNTER — Ambulatory Visit (HOSPITAL_COMMUNITY)
Admission: RE | Admit: 2023-07-04 | Discharge: 2023-07-04 | Disposition: A | Discharge status: Home / Self Care | Payer: Medicare Other | Attending: Urology | Admitting: Urology

## 2023-07-04 ENCOUNTER — Ambulatory Visit (HOSPITAL_BASED_OUTPATIENT_CLINIC_OR_DEPARTMENT_OTHER): Payer: Self-pay | Admitting: Anesthesiology

## 2023-07-04 ENCOUNTER — Ambulatory Visit (HOSPITAL_COMMUNITY)
Admission: RE | Admit: 2023-07-04 | Discharge: 2023-07-04 | Disposition: A | Discharge status: Home / Self Care | Source: Home / Self Care | Attending: Urology | Admitting: Urology

## 2023-07-04 ENCOUNTER — Encounter (HOSPITAL_COMMUNITY): Payer: Self-pay | Admitting: Urology

## 2023-07-04 ENCOUNTER — Ambulatory Visit (HOSPITAL_COMMUNITY): Payer: Self-pay | Admitting: Physician Assistant

## 2023-07-04 ENCOUNTER — Encounter (HOSPITAL_COMMUNITY)
Admission: RE | Disposition: A | Discharge status: Home / Self Care | Payer: Self-pay | Source: Home / Self Care | Attending: Urology

## 2023-07-04 DIAGNOSIS — C61 Malignant neoplasm of prostate: Secondary | ICD-10-CM | POA: Diagnosis not present

## 2023-07-04 DIAGNOSIS — I2510 Atherosclerotic heart disease of native coronary artery without angina pectoris: Secondary | ICD-10-CM | POA: Diagnosis not present

## 2023-07-04 DIAGNOSIS — K219 Gastro-esophageal reflux disease without esophagitis: Secondary | ICD-10-CM | POA: Insufficient documentation

## 2023-07-04 DIAGNOSIS — I4891 Unspecified atrial fibrillation: Secondary | ICD-10-CM | POA: Diagnosis not present

## 2023-07-04 DIAGNOSIS — Z191 Hormone sensitive malignancy status: Secondary | ICD-10-CM | POA: Diagnosis not present

## 2023-07-04 HISTORY — PX: RADIOACTIVE SEED IMPLANT: SHX5150

## 2023-07-04 HISTORY — PX: SPACE OAR INSTILLATION: SHX6769

## 2023-07-04 SURGERY — INSERTION, RADIATION SOURCE, PROSTATE
Anesthesia: General | Site: Pelvis

## 2023-07-04 MED ORDER — SODIUM CHLORIDE (PF) 0.9 % IJ SOLN
INTRAMUSCULAR | Status: AC
  Filled 2023-07-04: qty 20

## 2023-07-04 MED ORDER — MIDAZOLAM HCL 2 MG/2ML IJ SOLN
INTRAMUSCULAR | Status: AC
  Filled 2023-07-04: qty 2

## 2023-07-04 MED ORDER — OXYCODONE HCL 5 MG PO TABS: 5.0000 mg | ORAL_TABLET | Freq: Once | ORAL | Status: DC | PRN

## 2023-07-04 MED ORDER — LIDOCAINE HCL (PF) 2 % IJ SOLN
INTRAMUSCULAR | Status: AC
  Filled 2023-07-04: qty 5

## 2023-07-04 MED ORDER — ACETAMINOPHEN 160 MG/5ML PO SOLN: 325.0000 mg | ORAL | Status: DC | PRN

## 2023-07-04 MED ORDER — MEPERIDINE HCL 50 MG/ML IJ SOLN: 6.2500 mg | INTRAMUSCULAR | Status: DC | PRN

## 2023-07-04 MED ORDER — CHLORHEXIDINE GLUCONATE 0.12 % MT SOLN
15.0000 mL | Freq: Once | OROMUCOSAL | Status: AC
  Administered 2023-07-04: 15 mL via OROMUCOSAL

## 2023-07-04 MED ORDER — CIPROFLOXACIN IN D5W 400 MG/200ML IV SOLN
400.0000 mg | INTRAVENOUS | Status: AC
  Administered 2023-07-04: 400 mg via INTRAVENOUS
  Filled 2023-07-04: qty 200

## 2023-07-04 MED ORDER — ONDANSETRON HCL 4 MG/2ML IJ SOLN
INTRAMUSCULAR | Status: DC | PRN
  Administered 2023-07-04: 4 mg via INTRAVENOUS

## 2023-07-04 MED ORDER — EPHEDRINE 5 MG/ML INJ
INTRAVENOUS | Status: AC
  Filled 2023-07-04: qty 5

## 2023-07-04 MED ORDER — FLEET ENEMA RE ENEM: 1.0000 | ENEMA | Freq: Once | RECTAL | Status: DC

## 2023-07-04 MED ORDER — ORAL CARE MOUTH RINSE: 15.0000 mL | Freq: Once | OROMUCOSAL | Status: AC

## 2023-07-04 MED ORDER — PROPOFOL 10 MG/ML IV BOLUS
INTRAVENOUS | Status: AC
  Filled 2023-07-04: qty 20

## 2023-07-04 MED ORDER — LIDOCAINE HCL (CARDIAC) PF 100 MG/5ML IV SOSY
PREFILLED_SYRINGE | INTRAVENOUS | Status: DC | PRN
  Administered 2023-07-04: 60 mg via INTRAVENOUS

## 2023-07-04 MED ORDER — PROPOFOL 10 MG/ML IV BOLUS
INTRAVENOUS | Status: DC | PRN
  Administered 2023-07-04: 130 mg via INTRAVENOUS

## 2023-07-04 MED ORDER — ONDANSETRON HCL 4 MG/2ML IJ SOLN
INTRAMUSCULAR | Status: AC
  Filled 2023-07-04: qty 2

## 2023-07-04 MED ORDER — PHENYLEPHRINE 80 MCG/ML (10ML) SYRINGE FOR IV PUSH (FOR BLOOD PRESSURE SUPPORT)
PREFILLED_SYRINGE | INTRAVENOUS | Status: DC | PRN
  Administered 2023-07-04 (×2): 40 ug via INTRAVENOUS
  Administered 2023-07-04: 80 ug via INTRAVENOUS

## 2023-07-04 MED ORDER — EPHEDRINE SULFATE-NACL 50-0.9 MG/10ML-% IV SOSY
PREFILLED_SYRINGE | INTRAVENOUS | Status: DC | PRN
  Administered 2023-07-04 (×2): 5 mg via INTRAVENOUS

## 2023-07-04 MED ORDER — FENTANYL CITRATE PF 50 MCG/ML IJ SOSY: 25.0000 ug | PREFILLED_SYRINGE | INTRAMUSCULAR | Status: DC | PRN

## 2023-07-04 MED ORDER — FENTANYL CITRATE (PF) 100 MCG/2ML IJ SOLN
INTRAMUSCULAR | Status: DC | PRN
  Administered 2023-07-04 (×2): 50 ug via INTRAVENOUS

## 2023-07-04 MED ORDER — LACTATED RINGERS IV SOLN: INTRAVENOUS | Status: DC

## 2023-07-04 MED ORDER — OXYCODONE HCL 5 MG/5ML PO SOLN: 5.0000 mg | Freq: Once | ORAL | Status: DC | PRN

## 2023-07-04 MED ORDER — DEXAMETHASONE SODIUM PHOSPHATE 10 MG/ML IJ SOLN
INTRAMUSCULAR | Status: DC | PRN
  Administered 2023-07-04: 8 mg via INTRAVENOUS

## 2023-07-04 MED ORDER — PHENYLEPHRINE 80 MCG/ML (10ML) SYRINGE FOR IV PUSH (FOR BLOOD PRESSURE SUPPORT)
PREFILLED_SYRINGE | INTRAVENOUS | Status: AC
  Filled 2023-07-04: qty 10

## 2023-07-04 MED ORDER — 0.9 % SODIUM CHLORIDE (POUR BTL) OPTIME
TOPICAL | Status: DC | PRN
  Administered 2023-07-04: 1000 mL

## 2023-07-04 MED ORDER — MIDAZOLAM HCL 5 MG/5ML IJ SOLN
INTRAMUSCULAR | Status: DC | PRN
  Administered 2023-07-04: 1 mg via INTRAVENOUS

## 2023-07-04 MED ORDER — IOHEXOL 300 MG/ML  SOLN
INTRAMUSCULAR | Status: DC | PRN
  Administered 2023-07-04: 5 mL

## 2023-07-04 MED ORDER — ACETAMINOPHEN 325 MG PO TABS: 325.0000 mg | ORAL_TABLET | ORAL | Status: DC | PRN

## 2023-07-04 MED ORDER — DEXAMETHASONE SODIUM PHOSPHATE 10 MG/ML IJ SOLN
INTRAMUSCULAR | Status: AC
  Filled 2023-07-04: qty 1

## 2023-07-04 MED ORDER — FENTANYL CITRATE (PF) 100 MCG/2ML IJ SOLN
INTRAMUSCULAR | Status: AC
  Filled 2023-07-04: qty 2

## 2023-07-04 MED ORDER — ONDANSETRON HCL 4 MG/2ML IJ SOLN: 4.0000 mg | Freq: Once | INTRAMUSCULAR | Status: DC | PRN

## 2023-07-04 SURGICAL SUPPLY — 35 items
BAG COUNTER SPONGE SURGICOUNT (BAG) IMPLANT
BAG URINE DRAIN 2000ML AR STRL (UROLOGICAL SUPPLIES) ×1 IMPLANT
Bard Quicklink Cartridges with BrachySource I-125 IMPLANT
CATH FOLEY 2WAY SLVR 5CC 16FR (CATHETERS) ×2 IMPLANT
CATH ROBINSON RED A/P 20FR (CATHETERS) ×1 IMPLANT
COVER BACK TABLE 60X90IN (DRAPES) ×1 IMPLANT
COVER MAYO STAND STRL (DRAPES) ×1 IMPLANT
COVER SURGICAL LIGHT HANDLE (MISCELLANEOUS) ×1 IMPLANT
DRAPE U-SHAPE 47X51 STRL (DRAPES) ×1 IMPLANT
DRSG TEGADERM 4X4.75 (GAUZE/BANDAGES/DRESSINGS) ×1 IMPLANT
DRSG TEGADERM 8X12 (GAUZE/BANDAGES/DRESSINGS) ×1 IMPLANT
GLOVE BIO SURGEON STRL SZ7.5 (GLOVE) ×1 IMPLANT
GLOVE ECLIPSE 8.0 STRL XLNG CF (GLOVE) ×1 IMPLANT
GLOVE SURG LX STRL 7.5 STRW (GLOVE) ×2 IMPLANT
GOWN STRL REUS W/ TWL LRG LVL3 (GOWN DISPOSABLE) ×2 IMPLANT
GRID BRACH TEMP 18GA 2.8X3X.75 (MISCELLANEOUS) IMPLANT
HOLDER FOLEY CATH W/STRAP (MISCELLANEOUS) ×1 IMPLANT
IMPL SPACEOAR SYSTEM 10ML (Spacer) ×1 IMPLANT
IMPLANT SPACEOAR SYSTEM 10ML (Spacer) ×1 IMPLANT
KIT TURNOVER KIT A (KITS) IMPLANT
MARKER SKIN DUAL TIP RULER LAB (MISCELLANEOUS) ×1 IMPLANT
NDL BRACHY 18G 5PK (NEEDLE) ×5 IMPLANT
NDL BRACHY 18G SINGLE (NEEDLE) ×1 IMPLANT
NDL PK MORGANSTERN STABILIZ (NEEDLE) IMPLANT
NEEDLE BRACHY 18G 5PK (NEEDLE) ×7 IMPLANT
NEEDLE BRACHY 18G SINGLE (NEEDLE) ×1 IMPLANT
NEEDLE PK MORGANSTERN STABILIZ (NEEDLE) IMPLANT
PACK CYSTO (CUSTOM PROCEDURE TRAY) ×1 IMPLANT
PENCIL SMOKE EVACUATOR (MISCELLANEOUS) IMPLANT
SHEATH ULTRASOUND LF (SHEATH) IMPLANT
SHEATH ULTRASOUND LTX NONSTRL (SHEATH) IMPLANT
SURGILUBE 2OZ TUBE FLIPTOP (MISCELLANEOUS) ×1 IMPLANT
SYR 10ML LL (SYRINGE) ×1 IMPLANT
TOWEL OR 17X26 10 PK STRL BLUE (TOWEL DISPOSABLE) ×1 IMPLANT
UNDERPAD 30X36 HEAVY ABSORB (UNDERPADS AND DIAPERS) ×1 IMPLANT

## 2023-07-04 NOTE — Transfer of Care (Signed)
 Immediate Anesthesia Transfer of Care Note  Patient: Devin Rosales  Procedure(s) Performed: INSERTION, RADIATION SOURCE, PROSTATE (Pelvis) INJECTION, HYDROGEL SPACER (Pelvis)  Patient Location: PACU  Anesthesia Type:General  Level of Consciousness: awake, alert , oriented, and patient cooperative  Airway & Oxygen Therapy: Patient Spontanous Breathing and Patient connected to face mask oxygen  Post-op Assessment: Report given to RN and Post -op Vital signs reviewed and stable  Post vital signs: Reviewed and stable  Last Vitals:  Vitals Value Taken Time  BP 130/73 07/04/23 1203  Temp    Pulse 71 07/04/23 1206  Resp 10 07/04/23 1206  SpO2 100 % 07/04/23 1206  Vitals shown include unfiled device data.  Last Pain:  Vitals:   07/04/23 0813  TempSrc:   PainSc: 0-No pain         Complications: No notable events documented.

## 2023-07-04 NOTE — Anesthesia Procedure Notes (Addendum)
 Procedure Name: LMA Insertion Date/Time: 07/04/2023 10:35 AM  Performed by: Dennison Nancy, CRNAPre-anesthesia Checklist: Patient identified, Emergency Drugs available, Suction available, Patient being monitored and Timeout performed Patient Re-evaluated:Patient Re-evaluated prior to induction Oxygen Delivery Method: Circle system utilized Preoxygenation: Pre-oxygenation with 100% oxygen Induction Type: IV induction Ventilation: Mask ventilation without difficulty LMA: LMA inserted and LMA with gastric port inserted LMA Size: 4.0 Number of attempts: 1 Dental Injury: Teeth and Oropharynx as per pre-operative assessment

## 2023-07-04 NOTE — Anesthesia Postprocedure Evaluation (Signed)
 Anesthesia Post Note  Patient: Wylan Gentzler  Procedure(s) Performed: INSERTION, RADIATION SOURCE, PROSTATE (Pelvis) INJECTION, HYDROGEL SPACER (Pelvis)     Patient location during evaluation: PACU Anesthesia Type: General Level of consciousness: awake and alert Pain management: pain level controlled Vital Signs Assessment: post-procedure vital signs reviewed and stable Respiratory status: spontaneous breathing, nonlabored ventilation, respiratory function stable and patient connected to nasal cannula oxygen Cardiovascular status: blood pressure returned to baseline and stable Postop Assessment: no apparent nausea or vomiting Anesthetic complications: no   No notable events documented.  Last Vitals:  Vitals:   07/04/23 1215 07/04/23 1230  BP: 134/71 127/77  Pulse: 66 71  Resp: (!) 9 13  Temp: (!) 36.3 C   SpO2: 100% 95%    Last Pain:  Vitals:   07/04/23 1230  TempSrc:   PainSc: 0-No pain                 Shakila Mak

## 2023-07-04 NOTE — Op Note (Signed)
 PATIENT:  Devin Rosales  PRE-OPERATIVE DIAGNOSIS:  Adenocarcinoma of the prostate  POST-OPERATIVE DIAGNOSIS:  Same  PROCEDURE:  1. I-125 radioactive seed implantation 2. Cystoscopy  3. Placement of SpaceOAR  SURGEON:  Rhoderick Moody, MD  Radiation oncologist: Margaretmary Dys, MD  ANESTHESIA:  General  EBL:  Minimal  DRAINS: None  INDICATION: Devin Rosales is a 78 year old male with multifocal, T1c grade 1 prostate cancer.  He has elected to proceed with brachytherapy seed placement as definitive treatment of his prostate cancer.  He has been consented for the above procedures, voices understanding and wishes to proceed.  Description of procedure: After informed consent the patient was brought to the major OR, placed on the table and administered general anesthesia. He was then moved to the modified lithotomy position with his perineum perpendicular to the floor. His perineum and genitalia were then sterilely prepped. An official timeout was then performed. A 16 French Foley catheter was then placed in the bladder and filled with dilute contrast, a rectal tube was placed in the rectum and the transrectal ultrasound probe was placed in the rectum and affixed to the stand. He was then sterilely draped.  Real time ultrasonography was used along with the seed planning software. This was used to develop the seed plan including the number of needles as well as number of seeds required for complete and adequate coverage. Real-time ultrasonography was then used along with the previously developed plan and the Nucletron device to implant a total of 54 seeds using 17 needles. This proceeded without difficulty or complication.   I then proceeded with placement of SpaceOAR by introducing a needle with the bevel angled inferiorly approximately 2 cm superior to the anus. This was angled downward and under direct ultrasound was placed within the space between the prostatic capsule and rectum. This was  confirmed with a small amount of sterile saline injected and this was performed under direct ultrasound. I then attached the SpaceOAR to the needle and injected this in the space between the prostate and rectum with good placement noted.  A Foley catheter was then removed as well as the transrectal ultrasound probe and rectal probe. Flexible cystoscopy was then performed using the 16 French flexible scope which revealed a normal urethra throughout its length down to the sphincter which appeared intact. The prostatic urethra revealed bilobar hypertrophy but no evidence of obstruction, seeds, spacers or lesions. The bladder was then entered and fully and systematically inspected. The ureteral orifices were noted to be of normal configuration and position. The mucosa revealed no evidence of tumors. There were also no stones identified within the bladder. I noted no seeds or spacers on the floor of the bladder and retroflexion of the scope revealed no seeds protruding from the base of the prostate.  The cystoscope was then removed and the patient was awakened and taken to recovery room in stable and satisfactory condition. He tolerated procedure well and there were no intraoperative complications.  Plan: Discharge home

## 2023-07-04 NOTE — H&P (Signed)
 Urology Preoperative H&P   Chief Complaint: Prostate cancer   History of Present Illness: Devin Rosales is a 78 y.o. male with T1c, multifocal, grade 1 prostate cancer.   Last PSA: 5.2 (09/2022)--Fam hx of prostate cancer involving his brother   He has elected undergo brachytherapy seed placement as definitive therapy of his prostate cancer.  He has done well since his last visit and has no GU complaints today.   Past Medical History:  Diagnosis Date   BPH (benign prostatic hyperplasia)    CAD (coronary artery disease)    Cancer (HCC)    prostate   Esophageal spasm    Fast heart beat    GERD (gastroesophageal reflux disease)    Hx of insomnia    Hx of vertigo    BENIGN POSITIONAL   Hypercholesterolemia    Hyperlipidemia    Irritable bowel syndrome    Oral herpes simplex infection    Prostatitis 1998   Seasonal allergic rhinitis    Spermatocele    RIGHT    Past Surgical History:  Procedure Laterality Date   APPENDECTOMY     ATRIAL FIBRILLATION ABLATION N/A 01/04/2018   Procedure: ATRIAL FIBRILLATION ABLATION;  Surgeon: Regan Lemming, MD;  Location: [facility];  Service: Cardiovascular;  Laterality: N/A;   HERNIA REPAIR     TONSILLECTOMY     TREATMENT FISTULA ANAL      Allergies:  Allergies  Allergen Reactions   Crestor [Rosuvastatin Calcium]     MYALGIAS AND LETHARGY   Ezetimibe-Simvastatin Other (See Comments)    MYALGIAS Other reaction(s): myalgias   Lipitor [Atorvastatin]     MYALGIAS   Other     perfume and cologne causes congestion    Zocor [Simvastatin] Other (See Comments)    MYALGIAS    Family History  Problem Relation Age of Onset   Breast cancer Mother    Prostate cancer Brother    Hypertension Brother     Social History:  reports that he has never smoked. He has never used smokeless tobacco. He reports current alcohol use. He reports that he does not use drugs.  ROS: A complete review of systems was performed.  All systems are  negative except for pertinent findings as noted.  Physical Exam:  Vital signs in last 24 hours: Temp:  [98.1 F (36.7 C)] 98.1 F (36.7 C) (03/11 0804) Pulse Rate:  [70] 70 (03/11 0804) Resp:  [16] 16 (03/11 0804) BP: (135)/(88) 135/88 (03/11 0804) SpO2:  [99 %] 99 % (03/11 0804) Weight:  [63.5 kg] 63.5 kg (03/11 0813) Constitutional:  Alert and oriented, No acute distress Cardiovascular: Regular rate and rhythm, No JVD Respiratory: Normal respiratory effort, Lungs clear bilaterally GI: Abdomen is soft, nontender, nondistended, no abdominal masses GU: No CVA tenderness Lymphatic: No lymphadenopathy Neurologic: Grossly intact, no focal deficits Psychiatric: Normal mood and affect  Laboratory Data:  No results for input(s): "WBC", "HGB", "HCT", "PLT" in the last 72 hours.  No results for input(s): "NA", "K", "CL", "GLUCOSE", "BUN", "CALCIUM", "CREATININE" in the last 72 hours.  Invalid input(s): "CO3"   No results found for this or any previous visit (from the past 24 hours). No results found for this or any previous visit (from the past 240 hours).  Renal Function: No results for input(s): "CREATININE" in the last 168 hours. Estimated Creatinine Clearance: 67.3 mL/min (by C-G formula based on SCr of 0.77 mg/dL).  Radiologic Imaging: No results found.  I independently reviewed the above imaging studies.  Assessment and Plan Devin Rosales is a 78 y.o. male with multifocal, T1c grade 1 prostate cancer  -The patient was counseled about the natural history of prostate cancer and the standard treatment options that are available for prostate cancer. It was explained to him how his age and life expectancy, clinical stage, Gleason score, and PSA affect his prognosis, the decision to proceed with additional staging studies, as well as how that information influences recommended treatment strategies. We discussed the roles for active surveillance, radiation therapy, surgical therapy,  androgen deprivation, as well as ablative therapy options for the treatment of prostate cancer as appropriate to his individual cancer situation. We discussed the risks and benefits of these options with regard to their impact on cancer control and also in terms of potential adverse events, complications, and impact on quality of life particularly related to urinary and sexual function. The patient was encouraged to ask questions throughout the discussion today and all questions were answered to his stated satisfaction. In addition, the patient was provided with and/or directed to appropriate resources and literature for further education about prostate cancer and treatment options.   The patient has decided to proceed with cystoscopy, brachytherapy seed and SpaceOAR placement as primary treatment of his risk prostate cancer.  The risks, benefits and alternatives of the aforementioned procedures was discussed in detail.  Risks include, bur are not limited to worsening LUTS, erectile dysfunction, rectal irritation, urethral stricture formation, fistula formation, cancer recurrence, MI, CVA, PE, DVT and the inherent risk of general anesthesia.  He voices understanding and wishes to proceed.      Rhoderick Moody, MD 07/04/2023, 9:54 AM  [facility] Pager: [phone]

## 2023-07-05 ENCOUNTER — Encounter (HOSPITAL_COMMUNITY): Payer: Self-pay | Admitting: Urology

## 2023-07-07 ENCOUNTER — Encounter (HOSPITAL_COMMUNITY): Payer: Self-pay | Admitting: Urology

## 2023-07-10 NOTE — Progress Notes (Signed)
 Patient was a RadOnc Consult on 03/14/23 for his Stage T1c adenocarcinoma of the prostate with Gleason score of 3+3, and PSA of 5.2.   Patient proceed with treatment recommendations of brachytherapy and had his treatment on 3/11.   Patient is scheduled for a post seed CT Simulation on 4/2 and will follow up with urology on 4/11.

## 2023-07-16 ENCOUNTER — Other Ambulatory Visit: Payer: Self-pay | Admitting: Urology

## 2023-07-16 DIAGNOSIS — C61 Malignant neoplasm of prostate: Secondary | ICD-10-CM

## 2023-07-24 ENCOUNTER — Telehealth: Payer: Self-pay | Admitting: *Deleted

## 2023-07-24 DIAGNOSIS — E7800 Pure hypercholesterolemia, unspecified: Secondary | ICD-10-CM | POA: Diagnosis not present

## 2023-07-24 DIAGNOSIS — N400 Enlarged prostate without lower urinary tract symptoms: Secondary | ICD-10-CM | POA: Diagnosis not present

## 2023-07-24 DIAGNOSIS — I2510 Atherosclerotic heart disease of native coronary artery without angina pectoris: Secondary | ICD-10-CM | POA: Diagnosis not present

## 2023-07-24 DIAGNOSIS — I480 Paroxysmal atrial fibrillation: Secondary | ICD-10-CM | POA: Diagnosis not present

## 2023-07-24 NOTE — Telephone Encounter (Signed)
XXXX 

## 2023-07-24 NOTE — Telephone Encounter (Signed)
 CALLED PATIENT TO REMIND OF POST SEED APPTS. AND MRI FOR 07-26-23, SPOKE WITH PATIENT AND REMINDED OF APPTS. AND WENT OVER INSTRUCTIONS FOR MRI

## 2023-07-25 NOTE — Progress Notes (Signed)
 Radiation Oncology         (336) [phone] ________________________________  Name: Devin Rosales MRN: [id number]  Date: 07/26/2023  DOB: [birth year]  Post-Seed Follow-Up Visit Note  CC: [name], MD  [name]*  Diagnosis:   78 y.o. gentleman with Stage T1c adenocarcinoma of the prostate with Gleason score of 3+3, and PSA of 5.2.     ICD-10-CM   1. Malignant neoplasm of prostate (HCC)  C61       Interval Since Last Radiation:  3 weeks 07/04/23:  Insertion of radioactive I-125 seeds into the prostate gland; 145 Gy, definitive therapy with placement of SpaceOAR gel.  Narrative:  The patient returns today for routine follow-up.  He is complaining of increased urinary frequency and urinary hesitation symptoms. He filled out a questionnaire regarding urinary function today providing and overall IPSS score of 26 characterizing his symptoms as severe. He complains of dysuria, straining to start his stream, weak flow of stream, intermittency, and feelings of incomplete emptying. He denies gross hematuria, fever or chills and reports that his LUTS are mildly improved for the past 24 hours. He has been taking Flomax daily as prescribed and using ice packs prn.  His pre-implant score was 11. He denies any abdominal pain or bowel symptoms. Urinalysis in the office today is without evidence of bacterial infection. We did sent a Urine C&S just to be absolutely certain and will call him with those results as soon as they are available.  ALLERGIES:  is allergic to crestor [rosuvastatin calcium], ezetimibe-simvastatin, lipitor [atorvastatin], other, and zocor [simvastatin].  Meds: Current Outpatient Medications  Medication Sig Dispense Refill   aspirin EC 81 MG tablet Take 81 mg by mouth every evening. Swallow whole.     calcium carbonate (TUMS - DOSED IN MG ELEMENTAL CALCIUM) 500 MG chewable tablet Chew 1 tablet by mouth daily as needed for indigestion or heartburn.     ezetimibe (ZETIA) 10  MG tablet Take 10 mg by mouth daily.     Multiple Vitamin (MULTIVITAMIN WITH MINERALS) TABS tablet Take 1 tablet by mouth every morning.      Povidone, PF, (IVIZIA DRY EYES) 0.5 % SOLN Place 1-2 drops into both eyes 3 (three) times daily as needed (dry/irritated eyes.).     pravastatin (PRAVACHOL) 40 MG tablet Take 40 mg by mouth every evening.      pseudoephedrine (SUDAFED) 30 MG tablet Take 30 mg by mouth every 4 (four) hours as needed (sinus headaches).     tamsulosin (FLOMAX) 0.4 MG CAPS capsule Take 0.4 mg by mouth every evening.  0   valACYclovir (VALTREX) 1000 MG tablet Take 2,000 mg by mouth 2 (two) times daily as needed (fever blisters).     No current facility-administered medications for this visit.    Physical Findings: In general this is a well appearing Caucasian male in no acute distress. He's alert and oriented x4 and appropriate throughout the examination. Cardiopulmonary assessment is negative for acute distress and he exhibits normal effort.   Lab Findings: Lab Results  Component Value Date   WBC 5.5 06/21/2023   HGB 14.6 06/21/2023   HCT 44.6 06/21/2023   MCV 93.3 06/21/2023   PLT 249 06/21/2023    Radiographic Findings:  Patient underwent CT imaging in our clinic for post implant dosimetry. The CT will be reviewed by Dr. Kathrynn Running to confirm there is an adequate distribution of radioactive seeds throughout the prostate gland and ensure that there are no seeds in or near the  rectum.  We suspect the final radiation plan and dosimetry will show appropriate coverage of the prostate gland. He understands that we will call and inform him of any unexpected findings on further review of his imaging and dosimetry.  Impression/Plan: 78 y.o. gentleman with Stage T1c adenocarcinoma of the prostate with Gleason score of 3+3, and PSA of 5.2.  The patient is recovering from the effects of radiation. His urinary symptoms should gradually improve over the next 4-6 months. We talked  about this today. He is encouraged by his improvement and the recommendation to increase the Flomax to BID along with AZO prn and Alleve prn. He is otherwise pleased with his outcome. We also talked about long-term follow-up for prostate cancer following seed implant. He understands that ongoing PSA determinations and digital rectal exams will help perform surveillance to rule out disease recurrence. He has a follow up appointment scheduled with Dr. Liliane Shi on 08/04/23. He understands what to expect with his PSA measures. Patient was also educated today about some of the long-term effects from radiation including a small risk for rectal bleeding and possibly erectile dysfunction. We talked about some of the general management approaches to these potential complications. However, I did encourage the patient to contact our office or return at any point if he has questions or concerns related to his previous radiation and prostate cancer.    Devin Arbour, PA-C

## 2023-07-25 NOTE — Progress Notes (Signed)
  Radiation Oncology         [phone] ________________________________  Name: Dave Mergen MRN: [id number]  Date: 07/26/2023  DOB: 1946/03/18  COMPLEX SIMULATION NOTE  NARRATIVE:  The patient was brought to the CT Simulation planning suite today following prostate seed implantation approximately one month ago.  Identity was confirmed.  All relevant records and images related to the planned course of therapy were reviewed.  Then, the patient was set-up supine.  CT images were obtained.  The CT images were loaded into the planning software.  Then the prostate and rectum were contoured.  Treatment planning then occurred.  The implanted iodine 125 seeds were identified by the physics staff for projection of radiation distribution  I have requested : 3D Simulation  I have requested a DVH of the following structures: Prostate and rectum.    ________________________________  Artist Pais Kathrynn Running, M.D.

## 2023-07-26 ENCOUNTER — Ambulatory Visit
Admission: RE | Admit: 2023-07-26 | Discharge: 2023-07-26 | Disposition: A | Discharge status: Home / Self Care | Source: Ambulatory Visit | Attending: Radiation Oncology | Admitting: Radiation Oncology

## 2023-07-26 ENCOUNTER — Ambulatory Visit
Admission: RE | Admit: 2023-07-26 | Discharge: 2023-07-26 | Disposition: A | Discharge status: Home / Self Care | Payer: Self-pay | Source: Ambulatory Visit | Attending: Radiation Oncology | Admitting: Radiation Oncology

## 2023-07-26 ENCOUNTER — Telehealth: Payer: Self-pay

## 2023-07-26 ENCOUNTER — Encounter: Payer: Self-pay | Admitting: Urology

## 2023-07-26 ENCOUNTER — Ambulatory Visit (HOSPITAL_COMMUNITY)
Admission: RE | Admit: 2023-07-26 | Discharge: 2023-07-26 | Disposition: A | Discharge status: Home / Self Care | Source: Ambulatory Visit | Attending: Urology | Admitting: Urology

## 2023-07-26 ENCOUNTER — Ambulatory Visit
Admission: RE | Admit: 2023-07-26 | Discharge: 2023-07-26 | Disposition: A | Discharge status: Home / Self Care | Payer: Self-pay | Source: Ambulatory Visit | Attending: Urology | Admitting: Urology

## 2023-07-26 VITALS — BP 124/73 | HR 72 | Temp 97.6°F | Resp 19 | Ht 65.0 in | Wt 145.0 lb

## 2023-07-26 DIAGNOSIS — C61 Malignant neoplasm of prostate: Secondary | ICD-10-CM | POA: Insufficient documentation

## 2023-07-26 DIAGNOSIS — R300 Dysuria: Secondary | ICD-10-CM | POA: Diagnosis not present

## 2023-07-26 LAB — URINALYSIS, COMPLETE (UACMP) WITH MICROSCOPIC
Bacteria, UA: NONE SEEN
Bilirubin Urine: NEGATIVE
Glucose, UA: NEGATIVE mg/dL
Ketones, ur: NEGATIVE mg/dL
Leukocytes,Ua: NEGATIVE
Nitrite: NEGATIVE
Protein, ur: NEGATIVE mg/dL
Specific Gravity, Urine: 1.005 (ref 1.005–1.030)
pH: 7 (ref 5.0–8.0)

## 2023-07-26 NOTE — Progress Notes (Signed)
 Post-seed nursing interview for a diagnosis of Stage T1c adenocarcinoma of the prostate with Gleason score of 3+3, and PSA of 5.2.  Patient identity verified x2.   Patient reports polyuria, nocturia 5+, dysuria 8/10, and incomplete emptying. Patient denies all other related issues at this time.  Meaningful use complete.  I-PSS (AUA) score- 26 - Severe SHIM (ED) score- 4 Urinary Management medication(s) Tamsulosin Urology appointment date- 08/04/2023 with Dr. Sande Brothers at [facility]  Vitals- BP 124/73   Pulse 72   Temp 97.6 F (36.4 C) (Oral)   Resp 19   Ht 5\' 5"  (1.651 m)   Wt 145 lb (65.8 kg)   SpO2 99%   BMI 24.13 kg/m   This concludes the interaction.  Ruel Favors, LPN

## 2023-07-26 NOTE — Telephone Encounter (Signed)
 Called patient to inform him of the message below from [employer]. Patient identity verified x2.   Per Ashlyn Bruning- PA-C...... "Please call Mr. Blankenbeckler to let him know that the U/A is reassuring and does not look like UTI, likely just inflammation associated with the brachytherapy and this should improve with time. He can use OTC AZO to help with the dysuria, along with the Alleve and increased dose of tamsulosin we talked about today. The urine culture results will be back later this week and we will call with those results to confirm there is no infection. If he has questions or concerns in the interim, he is welcome to call at any time. Otherwise, plan to keep his scheduled follow up with Dr. Liliane Shi 08/04/23."  -Ashlyn   Patient verbalized understanding.  This concludes the interaction.  Ruel Favors, LPN

## 2023-07-27 LAB — URINE CULTURE: Culture: <10000 — AB

## 2023-07-28 NOTE — Progress Notes (Signed)
 RN spoke with patient regarding questions about use of AZO.  Patient reports improvement with use of AZO and tamsulosin, however, wanted to ensure he could continue AZO after 2 days.   RN reviewed with PA - OK to continue AZO for symptom relief due to knowing no infection present based on UA.   RN encouraged fluids, continuation of medication, and urology follow up.

## 2023-07-31 ENCOUNTER — Encounter: Payer: Self-pay | Admitting: Radiation Oncology

## 2023-07-31 DIAGNOSIS — Z191 Hormone sensitive malignancy status: Secondary | ICD-10-CM | POA: Diagnosis not present

## 2023-07-31 DIAGNOSIS — C61 Malignant neoplasm of prostate: Secondary | ICD-10-CM | POA: Diagnosis not present

## 2023-07-31 NOTE — Radiation Completion Notes (Signed)
 Patient Name: TWAN, HARKIN MRN: [id number] Date of Birth: 03/19/1946 Referring Physician: Rhoderick Moody, M.D. Date of Service: 2023-07-31 Radiation Oncologist: Margaretmary Bayley, M.D. [facility] [place]                             RADIATION ONCOLOGY END OF TREATMENT NOTE     Diagnosis: C61 Malignant neoplasm of prostate Staging on 2023-02-24: Malignant neoplasm of prostate (HCC) T=cT1c, N=cN0, M=cM0 Intent: Curative     ==========DELIVERED PLANS==========  Prostate Seed Implant Date: 2023-07-04   Plan Name: Prostate Seed Implant Site: Prostate Technique: Radioactive Seed Implant I-125 Mode: Brachytherapy Dose Per Fraction: 145 Gy Prescribed Dose (Delivered / Prescribed): 145 Gy / 145 Gy Prescribed Fxs (Delivered / Prescribed): 1 / 1     ==========ON TREATMENT VISIT DATES========== 2023-07-04     ==========UPCOMING VISITS========== 09/19/2023 [facility] OFFICE OFFICE VISIT Sheilah Pigeon, PA-C

## 2023-07-31 NOTE — Progress Notes (Signed)
  Radiation Oncology         [phone] ________________________________  Name: Devin Rosales MRN: [id number]  Date: 07/31/2023  DOB: 07-14-1945  3D Planning Note   Prostate Brachytherapy Post-Implant Dosimetry  Diagnosis: 78 y.o. gentleman with Stage T1c adenocarcinoma of the prostate with Gleason score of 3+3, and PSA of 5.2.   Narrative: On a previous date, Devin Rosales returned following prostate seed implantation for post implant planning. He underwent CT scan complex simulation to delineate the three-dimensional structures of the pelvis and demonstrate the radiation distribution.  Since that time, the seed localization, and complex isodose planning with dose volume histograms have now been completed.  Results:   Prostate Coverage - The dose of radiation delivered to the 90% or more of the prostate gland (D90) was 111.9% of the prescription dose. This exceeds our goal of greater than 90%. Rectal Sparing - The volume of rectal tissue receiving the prescription dose or higher was 0.0 cc. This falls under our thresholds tolerance of 1.0 cc.  Impression: The prostate seed implant appears to show adequate target coverage and appropriate rectal sparing.  Plan:  The patient will continue to follow with urology for ongoing PSA determinations. I would anticipate a high likelihood for local tumor control with minimal risk for rectal morbidity.  ________________________________  Artist Pais Kathrynn Running, M.D.

## 2023-08-04 ENCOUNTER — Encounter: Payer: Self-pay | Admitting: *Deleted

## 2023-08-04 DIAGNOSIS — C61 Malignant neoplasm of prostate: Secondary | ICD-10-CM | POA: Diagnosis not present

## 2023-08-24 ENCOUNTER — Encounter: Payer: Self-pay | Admitting: *Deleted

## 2023-08-24 ENCOUNTER — Inpatient Hospital Stay: Attending: Adult Health | Admitting: *Deleted

## 2023-08-24 DIAGNOSIS — C61 Malignant neoplasm of prostate: Secondary | ICD-10-CM

## 2023-08-24 NOTE — Progress Notes (Addendum)
 SCP reviewed and completed.Allergies and medications reviewed and updated. Vaccines are updated. Pt does not smoke nor drink. Pt is now taking 2 Tamsulosin  capsules per day as of April 9th. He said the burning with urination has calmed down some. Pt has nocturia 5-6 times nightly. He feels like daytime urination is better. Pt did mentioned that he had been straining at times earlier to complete his void and felt some swelling to the left groin area. Pt will see PCP soon and was advised to let PCP know about this. Pt said he did have a previous hernia 20+ years ago. Nutrition and exercise were discussed. Pt is an avid skier and hopes to be able to train on his [employer] Ski machine soon to get ready for season. Pt said he played a game of golf recently and the motion from the twisting aggravated the area and he had swelling to the surgical area. Pt has been doing lukewarm sitz bath once a day per Dr. Sherrine Dolly. This Navigator advised him to refrain from any exercise that would require twisting and pedaling. He walks 1-2 miles daily with wife and will resume upper body strength training. Reviewed and talked about the resources in the packet that were mailed to him. Pt says he will do the virtual nutrition class and the prostate support group upcoming. Pt will have post-tx  PSA labs on June 5th and f/u with Dr. Antonetta Kitchen.

## 2023-08-25 ENCOUNTER — Telehealth: Payer: Self-pay

## 2023-08-25 NOTE — Telephone Encounter (Signed)
 Patient identity verified x2.   Patient reports development of an inguinal hernia. He wanted to know if his prostate seeds have been in place long enough for him to have the hernia repaired.   Per Dr. Lorri Rota.... "Yes his seeds have been in place long enough (07/04/2023) for hernia repair."  Patient verbalized understanding and will reach out to his PCP Dr. Bryon Caraway & urologist Dr. Sherrine Dolly to inform them of this development.  This concludes the interaction.  Devin Bodo, LPN

## 2023-09-07 ENCOUNTER — Encounter: Payer: Self-pay | Admitting: Dietician

## 2023-09-07 NOTE — Progress Notes (Signed)
 Patient participated in the Nutrition and Cancer webinar. AICR guidelines were reviewed.  [facility] nutrition resources for survivors were encouraged (FYNN, cooking classes, individual MNT with outpatient RDs in weight management and DM management).  Contact information provided.  Carleen Chary, RDN, LDN Registered Dietitian, [facility] Part Time Remote (Usual office hours: Tuesday-Thursday) Mobile: [phone] Remote Office: [phone]

## 2023-09-08 ENCOUNTER — Other Ambulatory Visit: Payer: Self-pay | Admitting: Surgery

## 2023-09-08 DIAGNOSIS — K4090 Unilateral inguinal hernia, without obstruction or gangrene, not specified as recurrent: Secondary | ICD-10-CM | POA: Diagnosis not present

## 2023-09-12 ENCOUNTER — Encounter (HOSPITAL_BASED_OUTPATIENT_CLINIC_OR_DEPARTMENT_OTHER): Payer: Self-pay | Admitting: Surgery

## 2023-09-12 ENCOUNTER — Other Ambulatory Visit: Payer: Self-pay

## 2023-09-13 NOTE — Progress Notes (Signed)
 Patient was provided with CHG cleanser to use at home before the procedure. Patient verbalized understanding of instructions.Patient was provided with CHG cleanser to use at home before the procedure. Patient verbalized understanding of instructions.     Enhanced Recovery after Surgery Enhanced Recovery after Surgery is a protocol used to improve the stress on your body and your recovery after surgery.  Patient Instructions  The night before surgery:  No food after midnight. ONLY clear liquids after midnight  The day of surgery (if you do NOT have diabetes):  Drink ONE (1) Pre-Surgery Clear Ensure as directed.   This drink was given to you during your hospital  pre-op appointment visit. The pre-op nurse will instruct you on the time to drink the  Pre-Surgery Ensure depending on your surgery time. Finish the drink at the designated time by the pre-op nurse.  Nothing else to drink after completing the  Pre-Surgery Clear Ensure.  The day of surgery (if you have diabetes): Drink ONE (1) Gatorade 2 (G2) as directed. This drink was given to you during your hospital  pre-op appointment visit.  The pre-op nurse will instruct you on the time to drink the   Gatorade 2 (G2) depending on your surgery time. Color of the Gatorade may vary. Red is not allowed. Nothing else to drink after completing the  Gatorade 2 (G2).         If you have questions, please contact your surgeon's office.

## 2023-09-15 ENCOUNTER — Inpatient Hospital Stay: Admitting: Licensed Clinical Social Worker

## 2023-09-15 NOTE — Progress Notes (Signed)
 [facility] Healthcare Advance Directives Clinical Social Work  Patient presented to Advance Directives Clinic  to review and complete healthcare advance directives.  Clinical Social Worker met with patient.  The patient reports having HCPOA paperwork listing his wife as his HCPOA (not on file and not with him today). Patient brought in a previously completed Living Will for review.  CSW and pt discussed differnces between his prior living will and the [facility] AD documents. Pt will look over the documents at home with his wife and decide if he wants to update them.  Living will sent to medical records for scanning to patient's chart.  Clinical Social Worker encouraged patient/family to contact with any additional questions or concerns.   Tereasa Yilmaz E Caridad Silveira, LCSW Clinical Social Worker [facility] [facility]

## 2023-09-18 NOTE — Progress Notes (Unsigned)
  Cardiology Office Note:  .   Date:  09/18/2023  ID:  Devin Rosales, DOB [birth year], MRN [id number] PCP: Devin Mina, MD  [facility] Providers Cardiologist:  None Electrophysiologist:  Devin Cortland Ding, MD {  History of Present Illness: .   [name] is a 78 y.o. male w/PMHx of  HLD, IBS Prostate surgery (Radioactive seed implant, March 2025) AFib  He saw Dr. Lawana Rosales 09/23/22, doing well no symptoms of AFib, very active, still skiing, exercising   Today's visit is scheduled as an annual visit ROS:   He continues to do great! Devin Rosales he goes skiing monthly all around the [place] /and some [place] as well Otherwise he does road biking 14-15 miles on Wednesdays and [place] [employer] at home as well  No symptoms of Afib post ablation No CP, palpitations or cardiac awareness of any kind No SOB, DOE No near syncope or syncope   Arrhythmia/AAD hx AFib found 2018 AFib ablation 01/04/2018  Studies Reviewed: Devin Rosales    EKG done today and reviewed by myself:  SR 71bpm  01/04/2018: EPS/ablation CONCLUSIONS: 1. Sinus rhythm upon presentation.   2. Successful electrical isolation and anatomical encircling of all four pulmonary veins with radiofrequency current. 3. No inducible arrhythmias following ablation both on and off of dobutamine  4. No early apparent complications.  TTE 09/14/16  - Left ventricle: The cavity size was normal. Systolic function was   vigorous. The estimated ejection fraction was in the range of 65%   to 70%. Wall motion was normal; there were no regional wall   motion abnormalities. Doppler parameters are consistent with   abnormal left ventricular relaxation (grade 1 diastolic   dysfunction). - Aortic valve: There was mild regurgitation. - Aorta: Aortic root dimension: 39 mm (ED). - Ascending aorta: The ascending aorta was mildly dilated. - Mitral valve: There was mild regurgitation.  ETT 08/25/16 - personally reviewed Blood pressure demonstrated a normal  response to exercise. There was no ST segment deviation noted during stress. Clinically and electrically negative for ischemia Excellent exercise tolerance.   Monitor 10/09/16 - personally reviewed Parox AFIB detected (3%), occasional tachycardia. Post conversion pauses noted (none >3 seconds)      Risk Assessment/Calculations:    Physical Exam:   VS:  There were no vitals taken for this visit.   Wt Readings from Last 3 Encounters:  07/26/23 145 lb (65.8 kg)  07/04/23 140 lb (63.5 kg)  06/21/23 140 lb (63.5 kg)    GEN: Well nourished, well developed in no acute distress NECK: No JVD; No carotid bruits CARDIAC: RRR, no murmurs, rubs, gallops RESPIRATORY:  CTA b/l without rales, wheezing or rhonchi  ABDOMEN: Soft, non-tender, non-distended EXTREMITIES: No edema; No deformity   ASSESSMENT AND PLAN: .    paroxysmal AFib CHA2DS2Vasc is 2 (age) > OAC stopped post ablation (2020) No recurrent AFib post ablation, maintained off a/c  He would like to keep up with annual visits Ill see him next year, sooner if needed    Dispo: as above  Signed, Debbie Fails, PA-C

## 2023-09-19 ENCOUNTER — Ambulatory Visit: Attending: Physician Assistant | Admitting: Physician Assistant

## 2023-09-19 ENCOUNTER — Encounter: Payer: Self-pay | Admitting: Physician Assistant

## 2023-09-19 VITALS — BP 110/60 | HR 71 | Ht 65.5 in | Wt 141.8 lb

## 2023-09-19 DIAGNOSIS — I480 Paroxysmal atrial fibrillation: Secondary | ICD-10-CM | POA: Insufficient documentation

## 2023-09-19 NOTE — Patient Instructions (Signed)
 Medication Instructions:   Your physician recommends that you continue on your current medications as directed. Please refer to the Current Medication list given to you today.   *If you need a refill on your cardiac medications before your next appointment, please call your pharmacy*   Lab Work: NONE ORDERED  TODAY     If you have labs (blood work) drawn today and your tests are completely normal, you will receive your results only by: MyChart Message (if you have MyChart) OR A paper copy in the mail If you have any lab test that is abnormal or we need to change your treatment, we will call you to review the results.  Testing/Procedures: NONE ORDERED  TODAY     Follow-Up: At [facility], you and your health needs are our priority.  As part of our continuing mission to provide you with exceptional heart care, our providers are all part of one team.  This team includes your primary Cardiologist (physician) and Advanced Practice Providers or APPs (Physician Assistants and Nurse Practitioners) who all work together to provide you with the care you need, when you need it.  Your next appointment:    1 year(s)  Provider:    You may see Will Cortland Ding, MD or one of the following Advanced Practice Providers on your designated Care Team:   Mertha Abrahams, [place]     We recommend signing up for the patient portal called "MyChart".  Sign up information is provided on this After Visit Summary.  MyChart is used to connect with patients for Virtual Visits (Telemedicine).  Patients are able to view lab/test results, encounter notes, upcoming appointments, etc.  Non-urgent messages can be sent to your provider as well.   To learn more about what you can do with MyChart, go to [email or web address].   Other Instructions

## 2023-09-19 NOTE — H&P (Signed)
 REFERRING PHYSICIAN: Jearldine Mina, MD PROVIDER: Debi Fall, MD MRN: [id number] DOB: March 30, 1946 DATE OF ENCOUNTER: 09/08/2023 Subjective    Chief Complaint: New Consultation (Inguinal hernia)  History of Present Illness: Devin Rosales is a 78 y.o. male who is seen today as an office consultation for evaluation of New Consultation (Inguinal hernia)  This is a 78 year old gentleman who is referred here for a left inguinal hernia. He had undergone radiation seed placement for prostate cancer after straining to have bowel movements, he developed a bulge in his left inguinal area. He has been able to reduce the bulge but reports he has been increasingly getting larger and more painful. He has no obstructive symptoms. He has had a previous open right inguinal hernia repair with mesh approximately 20 years ago. He has had a previous cardiac ablation for A-fib and remains in normal sinus rhythm and is not on anticoagulation.  Review of Systems: A complete review of systems was obtained from the patient. I have reviewed this information and discussed as appropriate with the patient. See HPI as well for other ROS.  ROS   Medical History: Past Medical History:  Diagnosis Date  History of cancer  Hyperlipidemia   There is no problem list on file for this patient.  Past Surgical History:  Procedure Laterality Date  APPENDECTOMY  HERNIA REPAIR  PROSTATE SURGERY  TREATMENT FISTULA ANAL    No Known Allergies  Current Outpatient Medications on File Prior to Visit  Medication Sig Dispense Refill  co-enzyme Q-10, ubiquinone, (COQ-10) 100 mg capsule Take 100 mg by mouth once daily  ezetimibe (ZETIA) 10 mg tablet Take 10 mg by mouth once daily  multivitamin with minerals tablet Take 1 tablet by mouth every morning  naproxen sodium (ALEVE) 220 MG tablet Take 220 mg by mouth 2 (two) times daily with meals  povidone, PF, (IVIZIA, PF,) 0.5 % Drop Apply 1-2 drops to eye  pravastatin   (PRAVACHOL ) 40 MG tablet Take 40 mg by mouth every evening  tamsulosin  (FLOMAX ) 0.4 mg capsule Take 0.4 mg by mouth 2 (two) times daily   No current facility-administered medications on file prior to visit.   Family History  Problem Relation Age of Onset  Breast cancer Mother    Social History   Tobacco Use  Smoking Status Never  Smokeless Tobacco Never    Social History   Socioeconomic History  Marital status: Married  Tobacco Use  Smoking status: Never  Smokeless tobacco: Never  Vaping Use  Vaping status: Never Used  Substance and Sexual Activity  Alcohol use: Yes  Drug use: Never   Social Drivers of Health   Food Insecurity: No Food Insecurity (07/26/2023)  Received from [facility]  Hunger Vital Sign  Worried About Running Out of Food in the Last Year: Never true  Ran Out of Food in the Last Year: Never true  Transportation Needs: No Transportation Needs (07/26/2023)  Received from [facility]  Lack of Transportation (Medical): No  Lack of Transportation (Non-Medical): No  Housing Stability: Unknown (09/08/2023)  Housing Stability Vital Sign  Homeless in the Last Year: No   Objective:   Vitals:  09/08/23 0931 09/08/23 0932  BP: 119/74  Pulse: 92  Temp: 36.6 C (97.9 F)  SpO2: 94%  Weight: 63.9 kg (140 lb 12.8 oz)  Height: 166.4 cm (5' 5.5")  PainSc: 0-No pain  PainLoc: Groin   Body mass index is 23.07 kg/m.  Physical Exam   He appears well on  exam  He has a well-healed incision from his previous hernia repair on the right side  He has a tender, somewhat difficult to reduce but reducible left inguinal hernia. There is no evidence of umbilical hernia  Labs, Imaging and Diagnostic Testing: I have reviewed his notes in the electronic medical records  Assessment and Plan:   Diagnoses and all orders for this visit:  Left inguinal hernia   We discussed abdominal wall anatomy and hernias. He is well aware of this having  had previous hernia surgery. The left inguinal hernia is fairly symptomatic and causing him discomfort so I would recommend a left inguinal hernia repair soon as possible with mesh. I discussed both the laparoscopic and open techniques. I would recommend an open left inguinal hernia with a tap block and LMA anesthesia to limit paralytics and risk of urinary retention in his current situation. I explained the surgical procedure in detail. I discussed the risk which includes but is not limited to bleeding, infection, injury to surrounding structures, nerve entrapment, chronic pain, urinary retention, use of mesh, hernia recurrence, cardiopulmonary issues, postoperative recovery, etc. He understands and wished to proceed with surgery soon as possible given his symptoms.

## 2023-09-20 ENCOUNTER — Encounter (HOSPITAL_BASED_OUTPATIENT_CLINIC_OR_DEPARTMENT_OTHER): Payer: Self-pay | Admitting: Surgery

## 2023-09-20 ENCOUNTER — Other Ambulatory Visit: Payer: Self-pay

## 2023-09-20 ENCOUNTER — Ambulatory Visit (HOSPITAL_BASED_OUTPATIENT_CLINIC_OR_DEPARTMENT_OTHER): Admitting: Anesthesiology

## 2023-09-20 ENCOUNTER — Ambulatory Visit (HOSPITAL_BASED_OUTPATIENT_CLINIC_OR_DEPARTMENT_OTHER)
Admission: RE | Admit: 2023-09-20 | Discharge: 2023-09-20 | Disposition: A | Discharge status: Home / Self Care | Attending: Surgery | Admitting: Surgery

## 2023-09-20 ENCOUNTER — Encounter (HOSPITAL_BASED_OUTPATIENT_CLINIC_OR_DEPARTMENT_OTHER)
Admission: RE | Disposition: A | Discharge status: Home / Self Care | Payer: Self-pay | Source: Home / Self Care | Attending: Surgery

## 2023-09-20 DIAGNOSIS — I2510 Atherosclerotic heart disease of native coronary artery without angina pectoris: Secondary | ICD-10-CM | POA: Insufficient documentation

## 2023-09-20 DIAGNOSIS — Z01818 Encounter for other preprocedural examination: Secondary | ICD-10-CM

## 2023-09-20 DIAGNOSIS — K219 Gastro-esophageal reflux disease without esophagitis: Secondary | ICD-10-CM | POA: Diagnosis not present

## 2023-09-20 DIAGNOSIS — Z79899 Other long term (current) drug therapy: Secondary | ICD-10-CM | POA: Insufficient documentation

## 2023-09-20 DIAGNOSIS — Z8546 Personal history of malignant neoplasm of prostate: Secondary | ICD-10-CM | POA: Diagnosis not present

## 2023-09-20 DIAGNOSIS — K4090 Unilateral inguinal hernia, without obstruction or gangrene, not specified as recurrent: Secondary | ICD-10-CM | POA: Diagnosis not present

## 2023-09-20 DIAGNOSIS — G8918 Other acute postprocedural pain: Secondary | ICD-10-CM | POA: Diagnosis not present

## 2023-09-20 HISTORY — PX: INGUINAL HERNIA REPAIR: SHX194

## 2023-09-20 HISTORY — DX: Other complications of anesthesia, initial encounter: T88.59XA

## 2023-09-20 SURGERY — REPAIR, HERNIA, INGUINAL, ADULT
Anesthesia: General | Laterality: Left

## 2023-09-20 MED ORDER — FENTANYL CITRATE (PF) 100 MCG/2ML IJ SOLN
100.0000 ug | Freq: Once | INTRAMUSCULAR | Status: AC
  Administered 2023-09-20: 50 ug via INTRAVENOUS

## 2023-09-20 MED ORDER — DEXAMETHASONE SODIUM PHOSPHATE 10 MG/ML IJ SOLN
INTRAMUSCULAR | Status: DC | PRN
  Administered 2023-09-20: 10 mg via INTRAVENOUS

## 2023-09-20 MED ORDER — OXYCODONE HCL 5 MG PO TABS
ORAL_TABLET | ORAL | Status: AC
  Filled 2023-09-20: qty 1

## 2023-09-20 MED ORDER — ONDANSETRON HCL 4 MG/2ML IJ SOLN
INTRAMUSCULAR | Status: AC
Start: 2023-09-20 — End: ?
  Filled 2023-09-20: qty 2

## 2023-09-20 MED ORDER — ACETAMINOPHEN 10 MG/ML IV SOLN: 1000.0000 mg | Freq: Once | INTRAVENOUS | Status: DC | PRN

## 2023-09-20 MED ORDER — MIDAZOLAM HCL 2 MG/2ML IJ SOLN
INTRAMUSCULAR | Status: AC
  Filled 2023-09-20: qty 2

## 2023-09-20 MED ORDER — OXYCODONE HCL 5 MG/5ML PO SOLN: 5.0000 mg | Freq: Once | ORAL | Status: AC | PRN

## 2023-09-20 MED ORDER — FENTANYL CITRATE (PF) 100 MCG/2ML IJ SOLN
INTRAMUSCULAR | Status: DC | PRN
  Administered 2023-09-20: 50 ug via INTRAVENOUS

## 2023-09-20 MED ORDER — BUPIVACAINE-EPINEPHRINE 0.5% -1:200000 IJ SOLN
INTRAMUSCULAR | Status: DC | PRN
  Administered 2023-09-20: 10 mL

## 2023-09-20 MED ORDER — BUPIVACAINE-EPINEPHRINE (PF) 0.5% -1:200000 IJ SOLN
INTRAMUSCULAR | Status: AC
  Filled 2023-09-20: qty 30

## 2023-09-20 MED ORDER — PROPOFOL 10 MG/ML IV BOLUS
INTRAVENOUS | Status: AC
Start: 2023-09-20 — End: ?
  Filled 2023-09-20: qty 20

## 2023-09-20 MED ORDER — BUPIVACAINE-EPINEPHRINE (PF) 0.5% -1:200000 IJ SOLN
INTRAMUSCULAR | Status: DC | PRN
  Administered 2023-09-20: 30 mL

## 2023-09-20 MED ORDER — OXYCODONE HCL 5 MG PO TABS
5.0000 mg | ORAL_TABLET | Freq: Once | ORAL | Status: AC | PRN
  Administered 2023-09-20: 5 mg via ORAL

## 2023-09-20 MED ORDER — ACETAMINOPHEN 500 MG PO TABS
ORAL_TABLET | ORAL | Status: AC
Start: 2023-09-20 — End: ?
  Filled 2023-09-20: qty 2

## 2023-09-20 MED ORDER — DEXAMETHASONE SODIUM PHOSPHATE 10 MG/ML IJ SOLN
INTRAMUSCULAR | Status: AC
  Filled 2023-09-20: qty 1

## 2023-09-20 MED ORDER — FENTANYL CITRATE (PF) 100 MCG/2ML IJ SOLN
INTRAMUSCULAR | Status: AC
  Filled 2023-09-20: qty 2

## 2023-09-20 MED ORDER — LACTATED RINGERS IV SOLN: INTRAVENOUS | Status: DC

## 2023-09-20 MED ORDER — MIDAZOLAM HCL 2 MG/2ML IJ SOLN
2.0000 mg | Freq: Once | INTRAMUSCULAR | Status: AC
  Administered 2023-09-20: 1 mg via INTRAVENOUS

## 2023-09-20 MED ORDER — ONDANSETRON HCL 4 MG/2ML IJ SOLN
INTRAMUSCULAR | Status: DC | PRN
  Administered 2023-09-20: 4 mg via INTRAVENOUS

## 2023-09-20 MED ORDER — LIDOCAINE 2% (20 MG/ML) 5 ML SYRINGE
INTRAMUSCULAR | Status: AC
Start: 2023-09-20 — End: ?
  Filled 2023-09-20: qty 5

## 2023-09-20 MED ORDER — PHENYLEPHRINE 80 MCG/ML (10ML) SYRINGE FOR IV PUSH (FOR BLOOD PRESSURE SUPPORT)
PREFILLED_SYRINGE | INTRAVENOUS | Status: AC
  Filled 2023-09-20: qty 10

## 2023-09-20 MED ORDER — PROPOFOL 10 MG/ML IV BOLUS
INTRAVENOUS | Status: AC
  Filled 2023-09-20: qty 20

## 2023-09-20 MED ORDER — DROPERIDOL 2.5 MG/ML IJ SOLN: 0.6250 mg | Freq: Once | INTRAMUSCULAR | Status: DC | PRN

## 2023-09-20 MED ORDER — LIDOCAINE 2% (20 MG/ML) 5 ML SYRINGE
INTRAMUSCULAR | Status: DC | PRN
  Administered 2023-09-20: 40 mg via INTRAVENOUS

## 2023-09-20 MED ORDER — CHLORHEXIDINE GLUCONATE CLOTH 2 % EX PADS: 6.0000 | MEDICATED_PAD | Freq: Once | CUTANEOUS | Status: DC

## 2023-09-20 MED ORDER — PROPOFOL 10 MG/ML IV BOLUS
INTRAVENOUS | Status: DC | PRN
  Administered 2023-09-20: 130 mg via INTRAVENOUS

## 2023-09-20 MED ORDER — FENTANYL CITRATE (PF) 100 MCG/2ML IJ SOLN
INTRAMUSCULAR | Status: AC
Start: 2023-09-20 — End: ?
  Filled 2023-09-20: qty 2

## 2023-09-20 MED ORDER — CEFAZOLIN SODIUM-DEXTROSE 2-4 GM/100ML-% IV SOLN
2.0000 g | INTRAVENOUS | Status: AC
  Administered 2023-09-20: 2 g via INTRAVENOUS

## 2023-09-20 MED ORDER — CEFAZOLIN SODIUM-DEXTROSE 2-4 GM/100ML-% IV SOLN
INTRAVENOUS | Status: AC
  Filled 2023-09-20: qty 100

## 2023-09-20 MED ORDER — FENTANYL CITRATE (PF) 100 MCG/2ML IJ SOLN
25.0000 ug | INTRAMUSCULAR | Status: DC | PRN
  Administered 2023-09-20 (×4): 25 ug via INTRAVENOUS

## 2023-09-20 MED ORDER — ACETAMINOPHEN 500 MG PO TABS
1000.0000 mg | ORAL_TABLET | ORAL | Status: AC
  Administered 2023-09-20: 1000 mg via ORAL

## 2023-09-20 MED ORDER — TRAMADOL HCL 50 MG PO TABS: 50.0000 mg | ORAL_TABLET | Freq: Four times a day (QID) | ORAL | 0 refills | Status: AC | PRN

## 2023-09-20 MED ORDER — PHENYLEPHRINE 80 MCG/ML (10ML) SYRINGE FOR IV PUSH (FOR BLOOD PRESSURE SUPPORT)
PREFILLED_SYRINGE | INTRAVENOUS | Status: DC | PRN
  Administered 2023-09-20 (×2): 40 ug via INTRAVENOUS

## 2023-09-20 SURGICAL SUPPLY — 33 items
BLADE CLIPPER SURG (BLADE) ×1 IMPLANT
BLADE SURG 15 STRL LF DISP TIS (BLADE) ×1 IMPLANT
CANISTER SUCT 1200ML W/VALVE (MISCELLANEOUS) IMPLANT
CHLORAPREP W/TINT 26 (MISCELLANEOUS) ×1 IMPLANT
COVER BACK TABLE 60X90IN (DRAPES) ×1 IMPLANT
COVER MAYO STAND STRL (DRAPES) ×1 IMPLANT
DERMABOND ADVANCED .7 DNX12 (GAUZE/BANDAGES/DRESSINGS) ×1 IMPLANT
DRAIN PENROSE .5X12 LATEX STL (DRAIN) ×1 IMPLANT
DRAPE LAPAROTOMY 100X72 PEDS (DRAPES) ×1 IMPLANT
DRAPE UTILITY XL STRL (DRAPES) ×1 IMPLANT
ELECTRODE REM PT RTRN 9FT ADLT (ELECTROSURGICAL) ×1 IMPLANT
GLOVE SURG SIGNA 7.5 PF LTX (GLOVE) ×1 IMPLANT
GOWN STRL REUS W/ TWL LRG LVL3 (GOWN DISPOSABLE) ×1 IMPLANT
GOWN STRL REUS W/ TWL XL LVL3 (GOWN DISPOSABLE) ×1 IMPLANT
MESH PARIETEX PROGRIP LEFT (Mesh General) IMPLANT
NDL HYPO 25X1 1.5 SAFETY (NEEDLE) ×1 IMPLANT
NEEDLE HYPO 25X1 1.5 SAFETY (NEEDLE) ×1 IMPLANT
NS IRRIG 1000ML POUR BTL (IV SOLUTION) IMPLANT
PACK BASIN DAY SURGERY FS (CUSTOM PROCEDURE TRAY) ×1 IMPLANT
PENCIL SMOKE EVACUATOR (MISCELLANEOUS) ×1 IMPLANT
SLEEVE SCD COMPRESS KNEE MED (STOCKING) ×1 IMPLANT
SPIKE FLUID TRANSFER (MISCELLANEOUS) IMPLANT
SPONGE INTESTINAL PEANUT (DISPOSABLE) IMPLANT
SPONGE T-LAP 4X18 ~~LOC~~+RFID (SPONGE) ×1 IMPLANT
SUT MNCRL AB 4-0 PS2 18 (SUTURE) ×1 IMPLANT
SUT SILK 2 0 SH (SUTURE) IMPLANT
SUT VIC AB 2-0 CT1 TAPERPNT 27 (SUTURE) ×2 IMPLANT
SUT VIC AB 3-0 CT1 27XBRD (SUTURE) ×1 IMPLANT
SYR BULB EAR ULCER 3OZ GRN STR (SYRINGE) IMPLANT
SYR CONTROL 10ML LL (SYRINGE) ×1 IMPLANT
TOWEL GREEN STERILE FF (TOWEL DISPOSABLE) ×1 IMPLANT
TUBE CONNECTING 20X1/4 (TUBING) IMPLANT
YANKAUER SUCT BULB TIP NO VENT (SUCTIONS) IMPLANT

## 2023-09-20 NOTE — Progress Notes (Signed)
Assisted Dr. Hollis with left, transabdominal plane, ultrasound guided block. Side rails up, monitors on throughout procedure. See vital signs in flow sheet. Tolerated Procedure well. 

## 2023-09-20 NOTE — Anesthesia Postprocedure Evaluation (Signed)
 Anesthesia Post Note  Patient: Devin Rosales  Procedure(s) Performed: REPAIR, HERNIA, INGUINAL, ADULT (Left)     Patient location during evaluation: PACU Anesthesia Type: General Level of consciousness: awake and alert Pain management: pain level controlled Vital Signs Assessment: post-procedure vital signs reviewed and stable Respiratory status: spontaneous breathing, nonlabored ventilation, respiratory function stable and patient connected to nasal cannula oxygen Cardiovascular status: blood pressure returned to baseline and stable Postop Assessment: no apparent nausea or vomiting Anesthetic complications: no  No notable events documented.  Last Vitals:  Vitals:   09/20/23 1115 09/20/23 1129  BP: 127/66 135/76  Pulse: 68 71  Resp: 20 20  Temp:  (!) 36.3 C  SpO2: 98% 96%    Last Pain:  Vitals:   09/20/23 1132  TempSrc:   PainSc: 4                  Willian Harrow

## 2023-09-20 NOTE — Anesthesia Preprocedure Evaluation (Addendum)
 Anesthesia Evaluation  Patient identified by MRN, date of birth, ID band Patient awake    Reviewed: Allergy & Precautions, Patient's Chart, lab work & pertinent test results  Airway Mallampati: I  TM Distance: >3 FB Neck ROM: Full    Dental  (+) Teeth Intact, Dental Advisory Given   Pulmonary neg pulmonary ROS   breath sounds clear to auscultation       Cardiovascular + CAD  + dysrhythmias Atrial Fibrillation  Rhythm:Regular Rate:Normal     Neuro/Psych negative neurological ROS  negative psych ROS   GI/Hepatic Neg liver ROS,GERD  ,,  Endo/Other  negative endocrine ROS    Renal/GU negative Renal ROS     Musculoskeletal negative musculoskeletal ROS (+)    Abdominal   Peds  Hematology negative hematology ROS (+)   Anesthesia Other Findings   Reproductive/Obstetrics                             Anesthesia Physical Anesthesia Plan  ASA: 2  Anesthesia Plan: General   Post-op Pain Management: Regional block*   Induction: Intravenous  PONV Risk Score and Plan: 3 and Ondansetron , Dexamethasone  and Midazolam   Airway Management Planned: LMA  Additional Equipment: None  Intra-op Plan:   Post-operative Plan: Extubation in OR  Informed Consent: I have reviewed the patients History and Physical, chart, labs and discussed the procedure including the risks, benefits and alternatives for the proposed anesthesia with the patient or authorized representative who has indicated his/her understanding and acceptance.     Dental advisory given  Plan Discussed with: CRNA  Anesthesia Plan Comments:         Anesthesia Quick Evaluation

## 2023-09-20 NOTE — Transfer of Care (Signed)
 Immediate Anesthesia Transfer of Care Note  Patient: Devin Rosales  Procedure(s) Performed: REPAIR, HERNIA, INGUINAL, ADULT (Left)  Patient Location: PACU  Anesthesia Type:General and Regional  Level of Consciousness: drowsy  Airway & Oxygen Therapy: Patient Spontanous Breathing and Patient connected to face mask oxygen  Post-op Assessment: Report given to RN and Post -op Vital signs reviewed and stable  Post vital signs: Reviewed and stable  Last Vitals:  Vitals Value Taken Time  BP 122/67 09/20/23 1027  Temp    Pulse 66 09/20/23 1028  Resp 9 09/20/23 1028  SpO2 100 % 09/20/23 1028  Vitals shown include unfiled device data.  Last Pain:  Vitals:   09/20/23 0822  TempSrc: Temporal  PainSc: 0-No pain      Patients Stated Pain Goal: 3 (09/20/23 1610)  Complications: No notable events documented.

## 2023-09-20 NOTE — Anesthesia Procedure Notes (Signed)
 Anesthesia Regional Block: TAP block   Pre-Anesthetic Checklist: , timeout performed,  Correct Patient, Correct Site, Correct Laterality,  Correct Procedure, Correct Position, site marked,  Risks and benefits discussed,  Surgical consent,  Pre-op  evaluation,  At surgeon's request and post-op pain management  Laterality: Left  Prep: chloraprep       Needles:  Injection technique: Single-shot  Needle Type: Echogenic Stimulator Needle     Needle Length: 9cm  Needle Gauge: 21     Additional Needles:   Procedures:,,,, ultrasound used (permanent image in chart),,    Narrative:  Start time: 09/20/2023 8:45 AM End time: 09/20/2023 8:50 AM Injection made incrementally with aspirations every 5 mL.  Performed by: Personally  Anesthesiologist: Willian Harrow, MD  Additional Notes: Discussed risks and benefits of the nerve block in detail, including but not limited vascular injury, permanent nerve damage and infection.   Patient tolerated the procedure well. Local anesthetic introduced in an incremental fashion under minimal resistance after negative aspirations. No paresthesias were elicited. After completion of the procedure, no acute issues were identified and patient continued to be monitored by RN.

## 2023-09-20 NOTE — Anesthesia Procedure Notes (Signed)
 Procedure Name: LMA Insertion Date/Time: 09/20/2023 9:50 AM  Performed by: Adolphus Hoops, CRNAPre-anesthesia Checklist: Patient identified, Emergency Drugs available, Suction available and Patient being monitored Patient Re-evaluated:Patient Re-evaluated prior to induction Oxygen Delivery Method: Circle System Utilized Preoxygenation: Pre-oxygenation with 100% oxygen Induction Type: IV induction Ventilation: Mask ventilation without difficulty LMA: LMA inserted LMA Size: 5.0 Number of attempts: 1 Airway Equipment and Method: Bite block Placement Confirmation: positive ETCO2 Tube secured with: Tape Dental Injury: Teeth and Oropharynx as per pre-operative assessment

## 2023-09-20 NOTE — Op Note (Signed)
   Devin Rosales 09/20/2023   Pre-op  Diagnosis: LEFT INGUINAL HERNIA     Post-op Diagnosis: same  Procedure(s): OPEN LEFT INGUINAL HERNIA REPAIR WITH MESH  Surgeon(s): Oza Blumenthal, MD  Anesthesia: General  Staff:  Circulator: Jolaine Nasuti, RN; Maureen Sour, RN Scrub Person: Spencer Dy, CST  Estimated Blood Loss: Minimal               Findings: The patient was found to have an indirect left inguinal hernia which was repaired with a large piece of Prolene ProGrip mesh from [employer]  Procedure: The patient was brought to the operating room and identified the correct patient.  He had already received a tap block on the left side by anesthesiology under ultrasound guidance.  He was placed upon on the operating room table and general anesthesia was induced.  His left lower abdomen was then prepped and draped in the usual sterile fashion.  I anesthetized the skin with Marcaine  and then made a longitudinal incision with a scalpel.  I then dissected down through Scarpa's fascia with the electrocautery.  The external oblique fascia was identified and opened toward the internal and external rings.  The testicular cord and structures including the hernia sac were identified and controlled with a Penrose drain.  There was no evidence of indirect hernia.  All contents in the hernia sac had already been reduced.  I dissected the sac down to its base.  I then tied off the sac with a 2-0 silk suture and excised the redundant sac.  Next a large piece of Prolene ProGrip mesh was brought into the field.  I placed the mesh against the pubic tubercle and then brought around the cord structures covering the internal ring and then back to the pubic tubercle.  I sutured in place to the pubic tubercle with a 2-0 Vicryl suture and then to the transversalis fascia with 2-0 Vicryl sutures well.  Wide coverage of the inguinal floor and internal ring appeared to be achieved.  Hemostasis  appeared to be achieved.  I closed the external oblique fascia over the top of the mesh with a running 2-0 Vicryl suture.  Scarpa's fascia was closed with interrupted 3-0 Vicryl sutures and the skin was closed with a running 4-0 Monocryl.  Dermabond was then applied.  The patient tolerated the procedure well.  All the counts were correct at the end of the procedure.  The patient was then extubated in the operating room and taken in a stable condition to the recovery room.          Oza Blumenthal   Date: 09/20/2023  Time: 10:25 AM

## 2023-09-20 NOTE — Discharge Instructions (Addendum)
 [facility]  UMBILICAL OR INGUINAL HERNIA REPAIR: POST OP INSTRUCTIONS  Always review your discharge instruction sheet given to you by the facility where your surgery was performed. IF YOU HAVE DISABILITY OR FAMILY LEAVE FORMS, YOU MUST BRING THEM TO THE OFFICE FOR PROCESSING.   DO NOT GIVE THEM TO YOUR DOCTOR.  1. A  prescription for pain medication may be given to you upon discharge.  Take your pain medication as prescribed, if needed.  If narcotic pain medicine is not needed, then you may take acetaminophen  (Tylenol ) or ibuprofen (Advil) as needed. 2. Take your usually prescribed medications unless otherwise directed. If you need a refill on your pain medication, please contact your pharmacy.  They will contact our office to request authorization. Prescriptions will not be filled after 5 pm or on week-ends. 3. You should follow a light diet the first 24 hours after arrival home, such as soup and crackers, etc.  Be sure to include lots of fluids daily.  Resume your normal diet the day after surgery. 4.Most patients will experience some swelling and bruising around the umbilicus or in the groin and scrotum.  Ice packs and reclining will help.  Swelling and bruising can take several days to resolve.  6. It is common to experience some constipation if taking pain medication after surgery.  Increasing fluid intake and taking a stool softener (such as Colace) will usually help or prevent this problem from occurring.  A mild laxative (Milk of Magnesia or Miralax) should be taken according to package directions if there are no bowel movements after 48 hours. 7. Unless discharge instructions indicate otherwise, you may remove your bandages 24-48 hours after surgery, and you may shower at that time.  You may have steri-strips (small skin tapes) in place directly over the incision.  These strips should be left on the skin for 7-10 days.  If your surgeon used skin glue on the  incision, you may shower in 24 hours.  The glue will flake off over the next 2-3 weeks.  Any sutures or staples will be removed at the office during your follow-up visit. 8. ACTIVITIES:  You may resume regular (light) daily activities beginning the next day--such as daily self-care, walking, climbing stairs--gradually increasing activities as tolerated.  You may have sexual intercourse when it is comfortable.  Refrain from any heavy lifting or straining until approved by your doctor.  a.You may drive when you are no longer taking prescription pain medication, you can comfortably wear a seatbelt, and you can safely maneuver your car and apply brakes. b.RETURN TO WORK:   _____________________________________________  9.You should see your doctor in the office for a follow-up appointment approximately 2-3 weeks after your surgery.  Make sure that you call for this appointment within a day or two after you arrive home to insure a convenient appointment time. 10.OTHER INSTRUCTIONS: YOU MAY SHOWER STARTING TOMORROW ICE PACK, TYLENOL , AND IBUPROFEN ALSO FOR PAIN NO LIFTING MORE THAN 15 POUNDS FOR 4 WEEK    _____________________________________  WHEN TO CALL YOUR DOCTOR: Fever over 101.0 Inability to urinate Nausea and/or vomiting Extreme swelling or bruising Continued bleeding from incision. Increased pain, redness, or drainage from the incision  The clinic staff is available to answer your questions during regular business hours.  Please don't hesitate to call and ask to speak to one of the nurses for clinical concerns.  If you have a medical emergency, go to the nearest emergency room or call 911.  A surgeon from [facility] is always on call at the hospital   [address], [place], [place]  [address] ?  [address] [address], [place], [place]   [address] [phone] ? [phone] ? FAX [phone] Web site: [email or web address]  No Tylenol  before 2:30pm  today.   Post Anesthesia Home Care Instructions  Activity: Get plenty of rest for the remainder of the day. A responsible individual must stay with you for 24 hours following the procedure.  For the next 24 hours, DO NOT: -Drive a car -Advertising copywriter -Drink alcoholic beverages -Take any medication unless instructed by your physician -Make any legal decisions or sign important papers.  Meals: Start with liquid foods such as gelatin or soup. Progress to regular foods as tolerated. Avoid greasy, spicy, heavy foods. If nausea and/or vomiting occur, drink only clear liquids until the nausea and/or vomiting subsides. Call your physician if vomiting continues.  Special Instructions/Symptoms: Your throat may feel dry or sore from the anesthesia or the breathing tube placed in your throat during surgery. If this causes discomfort, gargle with warm salt water. The discomfort should disappear within 24 hours.  If you had a scopolamine patch placed behind your ear for the management of post- operative nausea and/or vomiting:  1. The medication in the patch is effective for 72 hours, after which it should be removed.  Wrap patch in a tissue and discard in the trash. Wash hands thoroughly with soap and water. 2. You may remove the patch earlier than 72 hours if you experience unpleasant side effects which may include dry mouth, dizziness or visual disturbances. 3. Avoid touching the patch. Wash your hands with soap and water after contact with the patch.

## 2023-09-20 NOTE — Interval H&P Note (Signed)
 History and Physical Interval Note:no change in H and P  09/20/2023 9:23 AM  Devin Rosales  has presented today for surgery, with the diagnosis of LEFT INGUINAL HERNIA.  The various methods of treatment have been discussed with the patient and family. After consideration of risks, benefits and other options for treatment, the patient has consented to  Procedure(s) with comments: REPAIR, HERNIA, INGUINAL, ADULT (Left) - OPEN LEFT INGUINAL HERNIA REPAIR WITH MESH as a surgical intervention.  The patient's history has been reviewed, patient examined, no change in status, stable for surgery.  I have reviewed the patient's chart and labs.  Questions were answered to the patient's satisfaction.     Oza Blumenthal

## 2023-09-21 ENCOUNTER — Encounter (HOSPITAL_BASED_OUTPATIENT_CLINIC_OR_DEPARTMENT_OTHER): Payer: Self-pay | Admitting: Surgery

## 2023-09-28 DIAGNOSIS — C61 Malignant neoplasm of prostate: Secondary | ICD-10-CM | POA: Diagnosis not present

## 2023-10-05 DIAGNOSIS — N401 Enlarged prostate with lower urinary tract symptoms: Secondary | ICD-10-CM | POA: Diagnosis not present

## 2023-10-05 DIAGNOSIS — C61 Malignant neoplasm of prostate: Secondary | ICD-10-CM | POA: Diagnosis not present

## 2023-10-05 DIAGNOSIS — R3915 Urgency of urination: Secondary | ICD-10-CM | POA: Diagnosis not present

## 2023-10-06 ENCOUNTER — Other Ambulatory Visit: Payer: Self-pay | Admitting: Urology

## 2023-10-06 DIAGNOSIS — C61 Malignant neoplasm of prostate: Secondary | ICD-10-CM

## 2023-10-16 DIAGNOSIS — G720 Drug-induced myopathy: Secondary | ICD-10-CM | POA: Diagnosis not present

## 2023-10-16 DIAGNOSIS — N400 Enlarged prostate without lower urinary tract symptoms: Secondary | ICD-10-CM | POA: Diagnosis not present

## 2023-10-16 DIAGNOSIS — E7800 Pure hypercholesterolemia, unspecified: Secondary | ICD-10-CM | POA: Diagnosis not present

## 2023-10-16 DIAGNOSIS — Z0000 Encounter for general adult medical examination without abnormal findings: Secondary | ICD-10-CM | POA: Diagnosis not present

## 2023-10-16 DIAGNOSIS — I2510 Atherosclerotic heart disease of native coronary artery without angina pectoris: Secondary | ICD-10-CM | POA: Diagnosis not present

## 2023-10-16 DIAGNOSIS — I480 Paroxysmal atrial fibrillation: Secondary | ICD-10-CM | POA: Diagnosis not present

## 2023-10-16 DIAGNOSIS — Z23 Encounter for immunization: Secondary | ICD-10-CM | POA: Diagnosis not present

## 2023-10-16 DIAGNOSIS — Z8546 Personal history of malignant neoplasm of prostate: Secondary | ICD-10-CM | POA: Diagnosis not present

## 2023-10-16 DIAGNOSIS — Z1331 Encounter for screening for depression: Secondary | ICD-10-CM | POA: Diagnosis not present

## 2023-11-27 ENCOUNTER — Ambulatory Visit
Admission: RE | Admit: 2023-11-27 | Discharge: 2023-11-27 | Disposition: A | Discharge status: Home / Self Care | Source: Ambulatory Visit | Attending: Urology | Admitting: Urology

## 2023-11-27 DIAGNOSIS — C61 Malignant neoplasm of prostate: Secondary | ICD-10-CM

## 2023-11-27 DIAGNOSIS — K5730 Diverticulosis of large intestine without perforation or abscess without bleeding: Secondary | ICD-10-CM | POA: Diagnosis not present

## 2023-11-27 MED ORDER — GADOPICLENOL 0.5 MMOL/ML IV SOLN
6.0000 mL | Freq: Once | INTRAVENOUS | Status: AC | PRN
  Administered 2023-11-27: 6 mL via INTRAVENOUS

## 2024-02-17 DIAGNOSIS — Z23 Encounter for immunization: Secondary | ICD-10-CM | POA: Diagnosis not present

## 2024-02-20 DIAGNOSIS — Z961 Presence of intraocular lens: Secondary | ICD-10-CM | POA: Diagnosis not present

## 2024-02-20 DIAGNOSIS — Z23 Encounter for immunization: Secondary | ICD-10-CM | POA: Diagnosis not present

## 2024-02-20 DIAGNOSIS — H40013 Open angle with borderline findings, low risk, bilateral: Secondary | ICD-10-CM | POA: Diagnosis not present

## 2024-02-20 DIAGNOSIS — H52201 Unspecified astigmatism, right eye: Secondary | ICD-10-CM | POA: Diagnosis not present

## 2024-02-22 DIAGNOSIS — L570 Actinic keratosis: Secondary | ICD-10-CM | POA: Diagnosis not present

## 2024-02-22 DIAGNOSIS — D225 Melanocytic nevi of trunk: Secondary | ICD-10-CM | POA: Diagnosis not present

## 2024-02-22 DIAGNOSIS — L821 Other seborrheic keratosis: Secondary | ICD-10-CM | POA: Diagnosis not present

## 2024-02-22 DIAGNOSIS — L720 Epidermal cyst: Secondary | ICD-10-CM | POA: Diagnosis not present

## 2024-02-22 DIAGNOSIS — D1801 Hemangioma of skin and subcutaneous tissue: Secondary | ICD-10-CM | POA: Diagnosis not present

## 2024-04-09 DIAGNOSIS — C61 Malignant neoplasm of prostate: Secondary | ICD-10-CM | POA: Diagnosis not present
